# Patient Record
Sex: Female | Born: 1975 | Race: White | Hispanic: No | Marital: Married | Smoking: Former smoker
Health system: Southern US, Community
[De-identification: ages and names within clinical notes are randomized; demographics above are authoritative.]

## PROBLEM LIST (undated history)

## (undated) DIAGNOSIS — Z889 Allergy status to unspecified drugs, medicaments and biological substances status: Secondary | ICD-10-CM

## (undated) DIAGNOSIS — T7840XA Allergy, unspecified, initial encounter: Secondary | ICD-10-CM

## (undated) HISTORY — DX: Allergy, unspecified, initial encounter: T78.40XA

## (undated) HISTORY — PX: OTHER SURGICAL HISTORY: SHX169

## (undated) HISTORY — PX: MINI-LAPAROTOMY: SHX2035

---

## 2004-09-18 ENCOUNTER — Inpatient Hospital Stay (HOSPITAL_COMMUNITY): Admission: AD | Admit: 2004-09-18 | Discharge: 2004-09-18 | Payer: Self-pay | Admitting: *Deleted

## 2004-09-20 ENCOUNTER — Inpatient Hospital Stay (HOSPITAL_COMMUNITY): Admission: AD | Admit: 2004-09-20 | Discharge: 2004-09-22 | Payer: Self-pay | Admitting: Obstetrics and Gynecology

## 2010-02-24 ENCOUNTER — Inpatient Hospital Stay (HOSPITAL_COMMUNITY): Admission: AD | Admit: 2010-02-24 | Payer: Self-pay | Admitting: Obstetrics and Gynecology

## 2010-06-10 ENCOUNTER — Inpatient Hospital Stay (HOSPITAL_COMMUNITY)
Admission: AD | Admit: 2010-06-10 | Discharge: 2010-06-10 | Disposition: A | Discharge status: Home / Self Care | Payer: 59 | Source: Ambulatory Visit | Attending: Obstetrics & Gynecology | Admitting: Obstetrics & Gynecology

## 2010-06-10 DIAGNOSIS — K645 Perianal venous thrombosis: Secondary | ICD-10-CM | POA: Insufficient documentation

## 2010-06-10 DIAGNOSIS — O228X9 Other venous complications in pregnancy, unspecified trimester: Secondary | ICD-10-CM | POA: Insufficient documentation

## 2010-06-14 ENCOUNTER — Inpatient Hospital Stay (HOSPITAL_COMMUNITY)
Admission: AD | Admit: 2010-06-14 | Discharge: 2010-06-16 | DRG: 775 | Disposition: A | Discharge status: Home / Self Care | Payer: 59 | Source: Ambulatory Visit | Attending: Obstetrics & Gynecology | Admitting: Obstetrics & Gynecology

## 2010-06-14 DIAGNOSIS — O878 Other venous complications in the puerperium: Principal | ICD-10-CM | POA: Diagnosis present

## 2010-06-14 DIAGNOSIS — K649 Unspecified hemorrhoids: Secondary | ICD-10-CM | POA: Diagnosis present

## 2010-06-14 LAB — CBC
HCT: 35.6 % — ABNORMAL LOW (ref 36.0–46.0)
Hemoglobin: 12.1 g/dL (ref 12.0–15.0)
RBC: 3.83 MIL/uL — ABNORMAL LOW (ref 3.87–5.11)
WBC: 8.9 10*3/uL (ref 4.0–10.5)

## 2010-06-14 LAB — RPR: RPR Ser Ql: NONREACTIVE

## 2010-06-15 LAB — CBC
HCT: 32.6 % — ABNORMAL LOW (ref 36.0–46.0)
Hemoglobin: 11.2 g/dL — ABNORMAL LOW (ref 12.0–15.0)
MCV: 93.1 fL (ref 78.0–100.0)
RBC: 3.5 MIL/uL — ABNORMAL LOW (ref 3.87–5.11)
RDW: 13 % (ref 11.5–15.5)
WBC: 16 10*3/uL — ABNORMAL HIGH (ref 4.0–10.5)

## 2010-07-13 NOTE — H&P (Signed)
NAMEDENISIA, HARPOLE                  ACCOUNT NO.:  [id number]   MEDICAL RECORD NO.:  [id number]           PATIENT TYPE:   LOCATION:                                 FACILITY:   PHYSICIAN:  Hatton B. Earlene Plater, M.D.  DATE OF BIRTH:  [birth year]   DATE OF ADMISSION:  DATE OF DISCHARGE:                                HISTORY & PHYSICAL   ADMISSION DIAGNOSES:  1.  Thirty-eight week intrauterine pregnancy.  2.  Low-grade fever.  3.  Contractions.  4.  Group B strep positive.   HISTORY OF PRESENT ILLNESS:  A 35 year old white female, gravida 4, para 2,  ectopic 1, who presents at 38+ weeks after multiple visits to the office and  hospital to be ruled out for labor.  States today she has noticed some  chills and low-grade fever of about 100 degrees at home and irregular  contractions.  She has no other localizing symptoms such as urinary or  respiratory to explain the fever.  Given the low-grade fever at term with  the history of group B strep and multiple recent exams, she is admitted for  induction and antibiotic coverage in case this represents group B strep-  related issue.   PRENATAL CARE:  Ma Hillock, OB/GYN, [place]. Earlene Plater, M.D..  Otherwise  uncomplicated.   PAST MEDICAL HISTORY:  IBS and hemorrhoids and cervical dysplasia.   PAST SURGICAL HISTORY:  Left salpingectomy for ectopic pregnancy.   MEDICATIONS:  Prenatal vitamins.   ALLERGIES:  No drug allergies noted.   SOCIAL HISTORY:  No alcohol, tobacco or other drugs.   PHYSICAL EXAMINATION:  VITAL SIGNS:  Weight 207, blood pressure 130/70,  temperature of 100.8, fetal heart tones 176.  GENERAL:  Alert and oriented, in no acute distress.  SKIN:  Warm and dry, no lesions.  CARDIAC:  Regular rate and rhythm.  CHEST:  Lungs clear to auscultation.  ABDOMEN:  Fundal height is appropriate.  The uterus is nontender.  PELVIC:  Cervix is 3-4 cm dilated, 50% effaced, -1 station, membranes are  intact.   ASSESSMENT:  A 38+ week  intrauterine pregnancy with low-grade fever and  complaints of chills.  Cannot rule out group B strep-related infection.  Given that she has had multiple cervical exams in the recent days and does  have  advanced dilation, we discussed this could be the potential issue.  Therefore, recommend delivery and coverage with antibiotics in the meantime.  Therefore, the patient will be admitted and induced, and will give her  penicillin and gentamicin during labor.       WBD/MEDQ  D:  09/20/2004  T:  09/20/2004  Job:  [id number]

## 2013-09-14 ENCOUNTER — Encounter (HOSPITAL_COMMUNITY): Payer: Self-pay | Admitting: Emergency Medicine

## 2013-09-14 DIAGNOSIS — L0889 Other specified local infections of the skin and subcutaneous tissue: Secondary | ICD-10-CM | POA: Insufficient documentation

## 2013-09-14 DIAGNOSIS — R6883 Chills (without fever): Secondary | ICD-10-CM | POA: Insufficient documentation

## 2013-09-14 LAB — COMPREHENSIVE METABOLIC PANEL
ALT: 17 U/L (ref 0–35)
AST: 17 U/L (ref 0–37)
Albumin: 4.8 g/dL (ref 3.5–5.2)
Alkaline Phosphatase: 26 U/L — ABNORMAL LOW (ref 39–117)
Anion gap: 14 (ref 5–15)
BUN: 12 mg/dL (ref 6–23)
CALCIUM: 9.7 mg/dL (ref 8.4–10.5)
CO2: 26 mEq/L (ref 19–32)
CREATININE: 0.74 mg/dL (ref 0.50–1.10)
Chloride: 100 mEq/L (ref 96–112)
GFR calc non Af Amer: >90 mL/min (ref >90)
Glucose, Bld: 108 mg/dL — ABNORMAL HIGH (ref 70–99)
Potassium: 4.1 mEq/L (ref 3.7–5.3)
SODIUM: 140 meq/L (ref 137–147)
TOTAL PROTEIN: 8.1 g/dL (ref 6.0–8.3)
Total Bilirubin: 0.6 mg/dL (ref 0.3–1.2)

## 2013-09-14 LAB — CBC
HCT: 41.8 % (ref 36.0–46.0)
HEMOGLOBIN: 14.6 g/dL (ref 12.0–15.0)
MCH: 31.9 pg (ref 26.0–34.0)
MCHC: 34.9 g/dL (ref 30.0–36.0)
MCV: 91.3 fL (ref 78.0–100.0)
Platelets: 266 10*3/uL (ref 150–400)
RBC: 4.58 MIL/uL (ref 3.87–5.11)
RDW: 12.9 % (ref 11.5–15.5)
WBC: 12.3 10*3/uL — AB (ref 4.0–10.5)

## 2013-09-14 NOTE — ED Notes (Signed)
Pt reports several draining bumps on inner thigh by groin. Pt reports Several drained and then healed. Pt reports another one suddenly developed and she is having chills. Tachy. Wound is not draining at this time. Inner right thigh beside labia majora is reddened.

## 2013-09-15 ENCOUNTER — Emergency Department (HOSPITAL_COMMUNITY)
Admission: EM | Admit: 2013-09-15 | Discharge: 2013-09-15 | Discharge status: Against Medical Advice | Payer: BC Managed Care – PPO | Attending: Emergency Medicine | Admitting: Emergency Medicine

## 2013-09-15 NOTE — ED Notes (Signed)
Registration called patient will no answser

## 2013-09-15 NOTE — ED Notes (Signed)
Pt called for room.  No answer x2  

## 2016-05-27 ENCOUNTER — Telehealth: Payer: Self-pay | Admitting: Behavioral Health

## 2016-05-27 NOTE — Telephone Encounter (Signed)
Attempted to reach patient for Pre-Visit Call. Unable to leave a message; per recording, the voice mailbox has not been setup.    

## 2016-05-29 ENCOUNTER — Ambulatory Visit (INDEPENDENT_AMBULATORY_CARE_PROVIDER_SITE_OTHER): Payer: Managed Care, Other (non HMO) | Admitting: Family Medicine

## 2016-05-29 ENCOUNTER — Ambulatory Visit: Payer: Self-pay | Admitting: Family

## 2016-05-29 ENCOUNTER — Encounter: Payer: Self-pay | Admitting: Family Medicine

## 2016-05-29 VITALS — BP 102/58 | HR 70 | Temp 98.3°F | Resp 16 | Ht 66.0 in | Wt 203.8 lb

## 2016-05-29 DIAGNOSIS — L0291 Cutaneous abscess, unspecified: Secondary | ICD-10-CM

## 2016-05-29 MED ORDER — DOXYCYCLINE HYCLATE 100 MG PO TABS: 100.0000 mg | ORAL_TABLET | Freq: Two times a day (BID) | ORAL | 0 refills | Status: DC

## 2016-05-29 NOTE — Progress Notes (Signed)
Chief Complaint  Patient presents with  . New Patient (Initial Visit)    pt report she has a bump on left to middle side of back. Patient report it started as a black head four years ago, it popped but came recently bigger and itchy.       New Patient Visit SUBJECTIVE: HPI: Kristi Walton is an 41 y.o.female who is being seen for establishing care.  The patient was previously seen at [facility] in [place], recently moved.  Patient has had a bump on her back for several years. Over the past 3 weeks, has gotten larger, somewhat itchy, painful. Since it is on her back, she does not have a good view of it. She is unsure if it is red. Nothing has drained from of thus far. She denies any fevers. She does have a history of MRSA.  Allergies  Allergen Reactions  . Sulfa Antibiotics     Past Medical History:  Diagnosis Date  . Allergy    Past Surgical History:  Procedure Laterality Date  . ivf     july 2003  . MINI-LAPAROTOMY Left    july 2003   Social History   Social History  . Marital status: Married   Social History Main Topics  . Smoking status: Former Smoker    Packs/day: 0.75    Years: 8.00    Types: Cigarettes    Quit date: 02/25/2002  . Smokeless tobacco: Never Used  . Alcohol use No  . Drug use: No  . Sexual activity: Yes    Partners: Male     Comment: Partner had vasectomy   Family History  Problem Relation Age of Onset  . Heart disease Neg Hx   . Cancer Neg Hx     Takes no medications routinely.  ROS Constitutional: Denies fevers  Skin: As noted in HPI   OBJECTIVE: BP (!) 102/58 (BP Location: Left Arm, Patient Position: Sitting, Cuff Size: Large)   Pulse 70   Temp 98.3 F (36.8 C) (Oral)   Resp 16   Ht  (1.676 m)   Wt 203 lb 12.8 oz (92.4 kg)   LMP 05/11/2016 (Exact Date)   SpO2 98%   BMI 32.89 kg/m   Constitutional: -  VS reviewed -  Well developed, well nourished, appears stated age -  No apparent distress  Psychiatric: -  Oriented  to person, place, and time -  Memory intact -  Affect and mood normal -  Fluent conversation, good eye contact -  Judgment and insight age appropriate  Eye: -  Conjunctivae clear, no discharge -  Pupils symmetric, round, reactive to light  ENMT: -  Oral mucosa without lesions, tongue and uvula midline    Tonsils not enlarged, no erythema, no exudate, trachea midline    Pharynx moist, no lesions, no erythema  Neck: -  No gross swelling, no palpable masses -  Thyroid midline, not enlarged, mobile, no palpable masses  Cardiovascular: -  RRR  Respiratory: -  Normal respiratory effort, no accessory muscle use, no retraction -  Breath sounds equal, no wheezes, no ronchi, no crackles  Musculoskeletal: -  No clubbing, no cyanosis -  Gait normal  Skin: -  On L side of back at approx T8-9, there is an elliptically shaped area of erythema with a pustular central opening, +fluctuance, +TTP, no streaking erythema -  Warm and dry to palpation   Procedure note; incision and drainage Informed consent obtained. The area was cleaned with Hibiclens.  The area was anesthetized with 2 mL of 1% lidocaine with epinephrine. Once adequate anesthesia was obtained, a cruciate incision was made with 11 blade scalpel. Approximately 10 mL of purulent material with blood was expressed. Cultures were taken. Loculations were interrupted with a curved hemostat. The area was packed with approximately 8 cm of 0.25 in iodoform gauze. The area was then dressed with gauze and a bandage. There were no complications noted. The patient tolerated the procedure well.   ASSESSMENT/PLAN: Abscess - Plan: doxycycline (VIBRA-TABS) 100 MG tablet  Patient instructed to sign release of records form from her previous PCP. Aftercare instructions given. Patient should return in 1 week to have wick removed. The patient voiced understanding and agreement to the plan.   Jilda Roche [place], DO 05/29/16  12:29 PM

## 2016-05-29 NOTE — Patient Instructions (Signed)
Do not shower for the rest of the day. When you do wash it, use only soap and water. Do not vigorously scrub. Apply triple antibiotic ointment (like Neosporin) twice daily. Keep the area clean and dry.   Things to look out for: increasing pain not relieved by ibuprofen/acetaminophen, fevers, spreading redness, drainage of pus, or foul odor.  

## 2016-05-29 NOTE — Progress Notes (Signed)
Pre visit review using our clinic review tool, if applicable. No additional management support is needed unless otherwise documented below in the visit note. 

## 2016-06-05 ENCOUNTER — Ambulatory Visit (INDEPENDENT_AMBULATORY_CARE_PROVIDER_SITE_OTHER): Payer: Managed Care, Other (non HMO) | Admitting: Family Medicine

## 2016-06-05 VITALS — BP 106/47 | HR 70 | Temp 98.6°F | Ht 66.0 in | Wt 205.2 lb

## 2016-06-05 DIAGNOSIS — Z09 Encounter for follow-up examination after completed treatment for conditions other than malignant neoplasm: Secondary | ICD-10-CM

## 2016-06-05 NOTE — Progress Notes (Signed)
Pre visit review using our clinic tool,if applicable. No additional management support is needed unless otherwise documented below in the visit note.  

## 2016-06-05 NOTE — Progress Notes (Signed)
Chief Complaint  Patient presents with  . Follow-up    Unpack area where cyst was removed   No fevers, pain, drainage.  BP (!) 106/47   Pulse 70   Temp 98.6 F (37 C) (Oral)   Ht  (1.676 m)   Wt 205 lb 3.2 oz (93.1 kg)   LMP 06/05/2016   SpO2 100%   BMI 33.12 kg/m   Encounter for recheck of abscess following incision and drainage  Site looks well, packing removed. Bandage replaced. Pt tolerated well. All questions answered. F/u prn. Pt voiced understanding and agreement to the plan.   Jilda Roche [place], DO 06/05/16 10:22 AM

## 2016-09-24 ENCOUNTER — Encounter: Payer: Self-pay | Admitting: Medical

## 2016-09-24 ENCOUNTER — Ambulatory Visit (INDEPENDENT_AMBULATORY_CARE_PROVIDER_SITE_OTHER): Payer: Managed Care, Other (non HMO) | Admitting: Medical

## 2016-09-24 VITALS — BP 121/61 | HR 65 | Temp 98.2°F | Resp 16 | Ht 66.0 in | Wt 202.6 lb

## 2016-09-24 DIAGNOSIS — N390 Urinary tract infection, site not specified: Secondary | ICD-10-CM

## 2016-09-24 DIAGNOSIS — R300 Dysuria: Secondary | ICD-10-CM | POA: Diagnosis not present

## 2016-09-24 LAB — POC URINALSYSI DIPSTICK (AUTOMATED)
Bilirubin, UA: NEGATIVE
Blood, UA: NEGATIVE
GLUCOSE UA: NEGATIVE
KETONES UA: NEGATIVE
LEUKOCYTES UA: NEGATIVE
Nitrite, UA: NEGATIVE
PROTEIN UA: NEGATIVE
Spec Grav, UA: 1.01 (ref 1.010–1.025)
UROBILINOGEN UA: NEGATIVE U/dL — AB
pH, UA: 6 (ref 5.0–8.0)

## 2016-09-24 MED ORDER — NITROFURANTOIN MONOHYD MACRO 100 MG PO CAPS: 100.0000 mg | ORAL_CAPSULE | Freq: Two times a day (BID) | ORAL | 0 refills | Status: DC

## 2016-09-24 NOTE — Patient Instructions (Addendum)
You appear to have a urinary tract infection by exam and symptoms. I am prescribing macrobid  antibiotic for the probable infection. Hydrate well. I am sending out a urine culture. During the interim if your signs and symptoms worsen rather than improving please notify us. We will notify your when the culture results are back.  Follow up in 7 days or as needed.

## 2016-09-24 NOTE — Progress Notes (Signed)
Subjective:    Patient ID: Kristi Walton, female    DOB: 07/07/1975, 41 y.o.   MRN: [id number]  HPI  Pt in today reporting urinary symptoms since past Thursday.  Dysuria- yes Frequent urination-yes Hesitancy- mild on initiating flow. Suprapubic pressure-yes Fever-no chills-no Nausea- mild 2 days. Vomiting-no CVA pain-faint sore lower back. History of UTI- 4 years ago last time.  Gross hematuria-no.  LMP-September 06, 2016. Normal  and came when was expected.  Pt has gyn appointment for Monday.  Pt has hydrated a lot and drinking cranberry juice.     Review of Systems  Constitutional: Negative for chills, fatigue and fever.  Respiratory: Negative for cough, shortness of breath and wheezing.   Cardiovascular: Negative for chest pain and palpitations.  Gastrointestinal: Negative for abdominal pain.  Genitourinary: Positive for dysuria, frequency and urgency. Negative for decreased urine volume, flank pain, hematuria, vaginal bleeding, vaginal discharge and vaginal pain.  Musculoskeletal: Negative for arthralgias and joint swelling.       See hpi for back pain.  Neurological: Negative for dizziness and headaches.  Hematological: Negative for adenopathy.  Psychiatric/Behavioral: Negative for behavioral problems and confusion.   Past Medical History:  Diagnosis Date  . Allergy      Social History   Social History  . Marital status: Married    Spouse name: N/A  . Number of children: N/A  . Years of education: N/A   Occupational History  . Not on file.   Social History Main Topics  . Smoking status: Former Smoker    Packs/day: 0.75    Years: 8.00    Types: Cigarettes    Quit date: 02/25/2002  . Smokeless tobacco: Never Used  . Alcohol use No  . Drug use: No  . Sexual activity: Yes    Partners: Male     Comment: Partner had vasectomy   Other Topics Concern  . Not on file   Social History Narrative  . No narrative on file    Past Surgical History:    Procedure Laterality Date  . ivf     july 2003  . MINI-LAPAROTOMY Left    july 2003    Family History  Problem Relation Age of Onset  . Heart disease Neg Hx   . Cancer Neg Hx     Allergies  Allergen Reactions  . Sulfa Antibiotics     No current outpatient prescriptions on file prior to visit.   No current facility-administered medications on file prior to visit.     BP 121/61   Pulse 65   Temp 98.2 F (36.8 C) (Oral)   Resp 16   Ht 5\' 6"  (1.676 m)   Wt 202 lb 9.6 oz (91.9 kg)   SpO2 100%   BMI 32.70 kg/m       Objective:   Physical Exam  General Appearance- Not in acute distress.  HEENT Eyes- Scleraeral/Conjuntiva-bilat- Not Yellow. Mouth & Throat- Normal.  Chest and Lung Exam Auscultation: Breath sounds:-Normal. Adventitious sounds:- No Adventitious sounds.  Cardiovascular Auscultation:Rythm - Regular. Heart Sounds -Normal heart sounds.  Abdomen Inspection:-Inspection Normal.  Palpation/Perucssion: Palpation and Percussion of the abdomen reveal- faint suprapubic Tendeness, No Rebound tenderness, No rigidity(Guarding) and No Palpable abdominal masses.  Liver:-Normal.  Spleen:- Normal.   Back- no cva pain.      Assessment & Plan:  You appear to have a urinary tract infection by exam and symptoms. I am prescribing macrobid  antibiotic for the probable infection. Hydrate well. I am  sending out a urine culture. During the interim if your signs and symptoms worsen rather than improving please notify us. We will notify your when the culture results are back.  Follow up in 7 days or as needed.  Taylinn Brabant, Ramon DredgeEdward, PA-C

## 2016-09-25 LAB — URINE CULTURE: Organism ID, Bacteria: NO GROWTH

## 2016-09-30 ENCOUNTER — Ambulatory Visit (INDEPENDENT_AMBULATORY_CARE_PROVIDER_SITE_OTHER): Payer: Managed Care, Other (non HMO) | Admitting: Family Medicine

## 2016-09-30 ENCOUNTER — Encounter: Payer: Self-pay | Admitting: Family Medicine

## 2016-09-30 VITALS — BP 109/72 | HR 63 | Ht 65.0 in | Wt 201.0 lb

## 2016-09-30 DIAGNOSIS — Z124 Encounter for screening for malignant neoplasm of cervix: Secondary | ICD-10-CM | POA: Diagnosis not present

## 2016-09-30 DIAGNOSIS — Z01419 Encounter for gynecological examination (general) (routine) without abnormal findings: Secondary | ICD-10-CM | POA: Diagnosis not present

## 2016-09-30 DIAGNOSIS — R300 Dysuria: Secondary | ICD-10-CM

## 2016-09-30 MED ORDER — PHENAZOPYRIDINE HCL 200 MG PO TABS: 200.0000 mg | ORAL_TABLET | Freq: Three times a day (TID) | ORAL | 1 refills | Status: DC | PRN

## 2016-09-30 NOTE — Progress Notes (Signed)
GYNECOLOGY ANNUAL PREVENTATIVE CARE ENCOUNTER NOTE  Subjective:   Kristi Walton is a 41 y.o. [phone] female here for a routine annual gynecologic exam.  Current complaints: dysuria and pelvic pressure. Started 11 days ago. Having frequency, hesitancy, suprapubic pressure. Stop primary care physician on 7/31, was prescribed Macrobid - UA and urine culture normal, however patient decided to continue the antibiotics. Today is her last day of antibiotics. This felt that her symptoms have improved, however have not resolved.   Denies abnormal vaginal bleeding, discharge, pelvic pain, problems with intercourse or other gynecologic concerns.    Menses - every 30 days x5-6 days. Monogamous relationship  Gynecologic History Patient's last menstrual period was 09/06/2016. Patient is sexually active  Contraception: vasectomy Last Pap: 3 years ago. Results were: normal Last mammogram: n/a.   Obstetric History OB History  Gravida Para Term Preterm AB Living  4 4 4     4   SAB TAB Ectopic Multiple Live Births          4    # Outcome Date GA Lbr Len/2nd Weight Sex Delivery Anes PTL Lv  4 Term 06/14/10 [redacted]w[redacted]d   M Vag-Spont   LIV  3 Term 09/20/04 [redacted]w[redacted]d   F Vag-Spont   LIV  2 Term 11/05/02 [redacted]w[redacted]d   M Vag-Spont   LIV  1 Term 08/31/97 [redacted]w[redacted]d   F Vag-Spont   LIV      Past Medical History:  Diagnosis Date  . Allergy     Past Surgical History:  Procedure Laterality Date  . ivf     july 2003  . MINI-LAPAROTOMY Left    july 2003    Current Outpatient Prescriptions on File Prior to Visit  Medication Sig Dispense Refill  . nitrofurantoin, macrocrystal-monohydrate, (MACROBID) 100 MG capsule Take 1 capsule (100 mg total) by mouth 2 (two) times daily. 14 capsule 0   No current facility-administered medications on file prior to visit.     Allergies  Allergen Reactions  . Sulfa Antibiotics     Social History   Social History  . Marital status: Married    Spouse name: N/A  . Number of  children: N/A  . Years of education: N/A   Occupational History  . Not on file.   Social History Main Topics  . Smoking status: Former Smoker    Packs/day: 0.75    Years: 8.00    Types: Cigarettes    Quit date: 02/25/2002  . Smokeless tobacco: Never Used  . Alcohol use No  . Drug use: No  . Sexual activity: Yes    Partners: Male    Birth control/ protection: None     Comment: Partner had vasectomy   Other Topics Concern  . Not on file   Social History Narrative  . No narrative on file    Family History  Problem Relation Age of Onset  . Breast cancer Maternal Aunt   . Heart disease Neg Hx   . Cancer Neg Hx     The following portions of the patient's history were reviewed and updated as appropriate: allergies, current medications, past family history, past medical history, past social history, past surgical history and problem list.  Review of Systems Pertinent items are noted in HPI.   Objective:  BP 109/72   Pulse 63   Ht 5\' 5"  (1.651 m)   Wt 201 lb (91.2 kg)   LMP 09/06/2016   BMI 33.45 kg/m  CONSTITUTIONAL: Well-developed, well-nourished female in no  acute distress.  HENT:  Normocephalic, atraumatic, External right and left ear normal. Oropharynx is clear and moist EYES: Conjunctivae and EOM are normal. Pupils are equal, round, and reactive to light. No scleral icterus.  NECK: Normal range of motion, supple, no masses.  Normal thyroid.   CARDIOVASCULAR: Normal heart rate noted, regular rhythm RESPIRATORY: Clear to auscultation bilaterally. Effort and breath sounds normal, no problems with respiration noted. BREASTS: Symmetric in size. No masses, skin changes, nipple drainage, or lymphadenopathy. ABDOMEN: Soft, normal bowel sounds, no distention noted.  No tenderness, rebound or guarding.  PELVIC: Normal appearing external genitalia; normal appearing vaginal mucosa and cervix.  No abnormal discharge noted.  Pap smear obtained.  Normal uterine size, no other  palpable masses, no uterine or adnexal tenderness. MUSCULOSKELETAL: Normal range of motion. No tenderness.  No cyanosis, clubbing, or edema.  2+ distal pulses. SKIN: Skin is warm and dry. No rash noted. Not diaphoretic. No erythema. No pallor. NEUROLOGIC: Alert and oriented to person, place, and time. Normal reflexes, muscle tone coordination. No cranial nerve deficit noted. PSYCHIATRIC: Normal mood and affect. Normal behavior. Normal judgment and thought content.  Assessment:  Annual gynecologic examination with pap smear   Plan:  1. Annual Gyn Exam Will follow up results of pap smear and manage accordingly. Mammogram scheduled STD testing discussed. Patient declined testing Discussed exercise and diet Will have routine labs done by PCP  2. Dysuria No gyn source of patient's discomfort. Will prescribe pyridium for symptomatic relief for the next several days. No evidence of infection - will see if improves over the next 7-10 days. If worsens or doesn't improve, would recommend referral to urology.  Routine preventative health maintenance measures emphasized. Please refer to After Visit Summary for other counseling recommendations.    Candelaria CelesteJacob Railyn House, DO [facility]

## 2016-09-30 NOTE — Patient Instructions (Signed)

## 2016-09-30 NOTE — Progress Notes (Signed)
Pt states she is due for pap smear, last 3 years ago. Pt has not yet had mammogram. Pt states she recently tx for UTI at PCP, currently taking abx. Pt states she is having some lower pelvic pain and heaviness.

## 2016-10-01 ENCOUNTER — Ambulatory Visit (HOSPITAL_BASED_OUTPATIENT_CLINIC_OR_DEPARTMENT_OTHER)
Admission: RE | Admit: 2016-10-01 | Discharge: 2016-10-01 | Disposition: A | Discharge status: Home / Self Care | Payer: Managed Care, Other (non HMO) | Source: Ambulatory Visit | Attending: Family Medicine | Admitting: Family Medicine

## 2016-10-01 ENCOUNTER — Encounter (HOSPITAL_BASED_OUTPATIENT_CLINIC_OR_DEPARTMENT_OTHER): Payer: Self-pay

## 2016-10-01 DIAGNOSIS — Z01419 Encounter for gynecological examination (general) (routine) without abnormal findings: Secondary | ICD-10-CM

## 2016-10-01 DIAGNOSIS — Z1231 Encounter for screening mammogram for malignant neoplasm of breast: Secondary | ICD-10-CM | POA: Insufficient documentation

## 2016-10-02 LAB — CYTOLOGY - PAP: DIAGNOSIS: NEGATIVE

## 2017-10-22 ENCOUNTER — Ambulatory Visit: Payer: Managed Care, Other (non HMO) | Admitting: Family Medicine

## 2017-12-19 ENCOUNTER — Encounter: Payer: Self-pay | Admitting: Medical

## 2017-12-19 ENCOUNTER — Ambulatory Visit (HOSPITAL_BASED_OUTPATIENT_CLINIC_OR_DEPARTMENT_OTHER)
Admission: RE | Admit: 2017-12-19 | Discharge: 2017-12-19 | Disposition: A | Discharge status: Home / Self Care | Payer: Managed Care, Other (non HMO) | Source: Ambulatory Visit | Attending: Medical | Admitting: Medical

## 2017-12-19 ENCOUNTER — Ambulatory Visit (INDEPENDENT_AMBULATORY_CARE_PROVIDER_SITE_OTHER): Payer: Managed Care, Other (non HMO) | Admitting: Medical

## 2017-12-19 VITALS — BP 117/68 | HR 72 | Temp 98.7°F | Resp 16 | Ht 65.0 in | Wt 201.6 lb

## 2017-12-19 DIAGNOSIS — M79662 Pain in left lower leg: Secondary | ICD-10-CM

## 2017-12-19 DIAGNOSIS — M5442 Lumbago with sciatica, left side: Secondary | ICD-10-CM

## 2017-12-19 DIAGNOSIS — M79672 Pain in left foot: Secondary | ICD-10-CM | POA: Diagnosis not present

## 2017-12-19 NOTE — Progress Notes (Signed)
Subjective:    Patient ID: Kristi Walton, female    DOB: 07-Dec-1975, 42 y.o.   MRN: [id number]  HPI  Pt in for some recent foot pain for months. 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 After limping more than usual  past 2 weeks she states she has developed low back pain with some radicular symptoms. Hx of back pain in past for 20 yrs but usually only one week  a year. States pain might last 5-7 days.   Pt states foot at times feels cold but not all the time.   Pt states foot pain has been since august. Has effected gait since august.   LMP- Dec 17, 2017.   No calf pain. No swelling. No sob. No long trips. Non smoker. No family history of DVT. No birth    Pt saw podiatrist 2 weeks ago. She states steroid injection helped for about one week but did not help past week.  Review of Systems  Constitutional: Negative for chills, fatigue and fever.  Respiratory: Negative for cough, chest tightness, shortness of breath and wheezing.   Cardiovascular: Negative for chest pain and palpitations.  Gastrointestinal: Negative for abdominal pain.  Musculoskeletal: Negative for back pain, myalgias and neck pain.       Foot pain and back pain.  Skin: Negative for rash.  Neurological: Negative for dizziness, seizures, speech difficulty, weakness, numbness and headaches.  Hematological: Negative for adenopathy. Does not bruise/bleed easily.  Psychiatric/Behavioral: Negative for behavioral problems and confusion.   Past Medical History:  Diagnosis Date  . Allergy      Social History   Socioeconomic History  . Marital status: Married    Spouse name: Not on file  . Number of children: Not on file  . Years of education: Not on file  . Highest education level: Not on file  Occupational History  . Not on file  Social Needs  . Financial resource strain: Not on file  . Food insecurity:    Worry: Not on file    Inability: Not on file  . Transportation needs:    Medical: Not on file    Non-medical:  Not on file  Tobacco Use  . Smoking status: Former Smoker    Packs/day: 0.75    Years: 8.00    Pack years: 6.00    Types: Cigarettes    Last attempt to quit: 02/25/2002    Years since quitting: 15.8  . Smokeless tobacco: Never Used  Substance and Sexual Activity  . Alcohol use: No  . Drug use: No  . Sexual activity: Yes    Partners: Male    Birth control/protection: None    Comment: Partner had vasectomy  Lifestyle  . Physical activity:    Days per week: Not on file    Minutes per session: Not on file  . Stress: Not on file  Relationships  . Social connections:    Talks on phone: Not on file    Gets together: Not on file    Attends religious service: Not on file    Active member of club or organization: Not on file    Attends meetings of clubs or organizations: Not on file    Relationship status: Not on file  . Intimate partner violence:    Fear of current or ex partner: Not on file    Emotionally abused: Not on file    Physically abused: Not on file    Forced sexual activity: Not on file  Other Topics  Concern  . Not on file  Social History Narrative  . Not on file    Past Surgical History:  Procedure Laterality Date  . ivf     july 2003  . MINI-LAPAROTOMY Left    july 2003    Family History  Problem Relation Age of Onset  . Breast cancer Maternal Aunt   . Heart disease Neg Hx   . Cancer Neg Hx     Allergies  Allergen Reactions  . Sulfa Antibiotics     Current Outpatient Medications on File Prior to Visit  Medication Sig Dispense Refill  . nitrofurantoin, macrocrystal-monohydrate, (MACROBID) 100 MG capsule Take 1 capsule (100 mg total) by mouth 2 (two) times daily. 14 capsule 0  . phenazopyridine (PYRIDIUM) 200 MG tablet Take 1 tablet (200 mg total) by mouth 3 (three) times daily as needed for pain (urethral spasm). 10 tablet 1   No current facility-administered medications on file prior to visit.     BP 117/68   Pulse 72   Temp 98.7 F (37.1 C)  (Oral)   Resp 16   Ht 5\' 5"  (1.651 m)   Wt 201 lb 9.6 oz (91.4 kg)   SpO2 100%   BMI 33.55 kg/m       Objective:   Physical Exam  General Appearance- Not in acute distress.    Chest and Lung Exam Auscultation: Breath sounds:-Normal. Clear even and unlabored. Adventitious sounds:- No Adventitious sounds.  Cardiovascular Auscultation:Rythm - Regular, rate and rythm. Heart Sounds -Normal heart sounds.  Abdomen Inspection:-Inspection Normal.  Palpation/Perucssion: Palpation and Percussion of the abdomen reveal- Non Tender, No Rebound tenderness, No rigidity(Guarding) and No Palpable abdominal masses.  Liver:-Normal.  Spleen:- Normal.   Back Mid lumbar spine tenderness to palpation left si area.  Pain on straight leg lift. Pain on lateral movements and flexion/extension of the spine. Patella reflexes- hyper-reflex.  Lower ext neurologic  L5-S1 sensation intact bilaterally.(but pain lifting her left great toe. Pain present before back flare) Normal patellar reflexes bilaterally. No foot drop bilaterally.  Left foot-nml pulses. Good capillary refill. Slight colder to touch than rt side. Left lower ext- no swelling to calf, neg homans signs. Symetric compared to rt side.      Assessment & Plan:  For your recent left-sided sciatica type back pain, you can continue with Tylenol and conservative back stretching exercises.  If the back pain persist or worsens keep in mind that could send in taper dose of prednisone and prescription of gabapentin for neuropathic/nerve type pain.  Please get x-ray of the lumbar spine today.  If x-ray were normal and radicular type pain persist then might consider MRI.  For your left foot pain/sesamoiditis, continue treatment regimen prescribed by podiatrist.  Since the pain is persisting despite these measures then I would strongly consider getting the MRI as podiatrist mentioned.  You expressed concern for left lower extremity DVT.  After  discussion decided we would go ahead and get left lower extremity ultrasound today.  You can get that done after you get your lumbar spine x-ray.  Follow-up in 7 to 10 days or as needed.  Red flag signs symptoms regarding back pain that would indicate need for ED evaluation reviewed  Esperanza Richters, PA-C

## 2017-12-19 NOTE — Patient Instructions (Signed)
For your recent left-sided sciatica type back pain, you can continue with Tylenol and conservative back stretching exercises.  If the back pain persist or worsens keep in mind that could send in taper dose of prednisone and prescription of gabapentin for neuropathic/nerve type pain.  Please get x-ray of the lumbar spine today.  If x-ray were normal and radicular type pain persist then might consider MRI.  For your left foot pain/sesamoiditis, continue treatment regimen prescribed by podiatrist.  Since the pain is persisting despite these measures then I would strongly consider getting the MRI as podiatrist mentioned.  You expressed concern for left lower extremity DVT.  After discussion decided we would go ahead and get left lower extremity ultrasound today.  You can get that done after you get your lumbar spine x-ray.  Follow-up in 7 to 10 days or as needed.  Red flag signs symptoms regarding back pain that would indicate need for ED evaluation reviewed.

## 2018-03-30 ENCOUNTER — Encounter: Payer: Self-pay | Admitting: Medical

## 2018-03-30 ENCOUNTER — Ambulatory Visit (INDEPENDENT_AMBULATORY_CARE_PROVIDER_SITE_OTHER): Payer: BLUE CROSS/BLUE SHIELD | Admitting: Medical

## 2018-03-30 VITALS — BP 119/78 | HR 79 | Resp 12 | Ht 65.0 in | Wt 203.0 lb

## 2018-03-30 DIAGNOSIS — G8929 Other chronic pain: Secondary | ICD-10-CM

## 2018-03-30 DIAGNOSIS — M545 Low back pain: Secondary | ICD-10-CM

## 2018-03-30 NOTE — Patient Instructions (Signed)
For your low back pain, I want you to continue conservative stretching exercises at home and use Tylenol for mild pain.  You can use ibuprofen for more severe pain if needed.  However use minimal dose as ibuprofen is only NSAID that you can tolerate historically.  I did go ahead and place referral to physical therapy.  Hopefully this will help.  If not then might consider sports medicine.  Also occupational therapy might be helpful to minimize pain with activities of daily living.  Follow-up date to be determined after appointments with physical therapy.  Also follow-up as regular scheduled with your PCP.

## 2018-03-30 NOTE — Progress Notes (Signed)
Subjective:    Patient ID: Kristi Walton, female    DOB: 05/04/1975, 43 y.o.   MRN: [id number]  HPI  Pt in for follow up.  Pt has long history of back pain. She state back pain hx for 20 years. She had last event from October to about November. She got better but then recently re-fare of pain more than 3 weeks which is unusual for her. Conservative measures are not helping much.  No weakness to leg or radiating pain.  Conservative measures are not helping. She is trying tylenol and some basic stretching exercises.  She wants to be referred to PT  Pt pain level is 1/10 now. But will fare randomly to 8/10.  Most of time time pain level ranges 1-3 or 4.  Pt does not like to use nsaids.(when pain gets severe will use ibuprofen)   Review of Systems  Constitutional: Negative for chills, fatigue and fever.  Respiratory: Negative for cough, chest tightness, shortness of breath and wheezing.   Cardiovascular: Negative for chest pain and palpitations.  Musculoskeletal: Positive for back pain.  Skin: Negative for rash.  Neurological: Negative for dizziness, weakness, numbness and headaches.  Hematological: Negative for adenopathy. Does not bruise/bleed easily.  Psychiatric/Behavioral: Negative for behavioral problems and confusion.    Past Medical History:  Diagnosis Date  . Allergy      Social History   Socioeconomic History  . Marital status: Married    Spouse name: Not on file  . Number of children: Not on file  . Years of education: Not on file  . Highest education level: Not on file  Occupational History  . Not on file  Social Needs  . Financial resource strain: Not on file  . Food insecurity:    Worry: Not on file    Inability: Not on file  . Transportation needs:    Medical: Not on file    Non-medical: Not on file  Tobacco Use  . Smoking status: Former Smoker    Packs/day: 0.75    Years: 8.00    Pack years: 6.00    Types: Cigarettes    Last attempt to quit:  02/25/2002    Years since quitting: 16.1  . Smokeless tobacco: Never Used  Substance and Sexual Activity  . Alcohol use: No  . Drug use: No  . Sexual activity: Yes    Partners: Male    Birth control/protection: None    Comment: Partner had vasectomy  Lifestyle  . Physical activity:    Days per week: Not on file    Minutes per session: Not on file  . Stress: Not on file  Relationships  . Social connections:    Talks on phone: Not on file    Gets together: Not on file    Attends religious service: Not on file    Active member of club or organization: Not on file    Attends meetings of clubs or organizations: Not on file    Relationship status: Not on file  . Intimate partner violence:    Fear of current or ex partner: Not on file    Emotionally abused: Not on file    Physically abused: Not on file    Forced sexual activity: Not on file  Other Topics Concern  . Not on file  Social History Narrative  . Not on file    Past Surgical History:  Procedure Laterality Date  . ivf     july 2003  . MINI-LAPAROTOMY  Left    july 2003    Family History  Problem Relation Age of Onset  . Breast cancer Maternal Aunt   . Heart disease Neg Hx   . Cancer Neg Hx     Allergies  Allergen Reactions  . Sulfa Antibiotics     Current Outpatient Medications on File Prior to Visit  Medication Sig Dispense Refill  . nitrofurantoin, macrocrystal-monohydrate, (MACROBID) 100 MG capsule Take 1 capsule (100 mg total) by mouth 2 (two) times daily. 14 capsule 0  . phenazopyridine (PYRIDIUM) 200 MG tablet Take 1 tablet (200 mg total) by mouth 3 (three) times daily as needed for pain (urethral spasm). 10 tablet 1   No current facility-administered medications on file prior to visit.     BP 119/78   Pulse 79   Resp 12   Ht 5\' 5"  (1.651 m)   Wt 203 lb (92.1 kg)   BMI 33.78 kg/m       Objective:   Physical Exam  General Appearance- Not in acute distress.    Chest and Lung  Exam Auscultation: Breath sounds:-Normal. Clear even and unlabored. Adventitious sounds:- No Adventitious sounds.  Cardiovascular Auscultation:Rythm - Regular, rate and rythm. Heart Sounds -Normal heart sounds.  Abdomen Inspection:-Inspection Normal.  Palpation/Perucssion: Palpation and Percussion of the abdomen reveal- Non Tender, No Rebound tenderness, No rigidity(Guarding) and No Palpable abdominal masses.  Liver:-Normal.  Spleen:- Normal.   Back Mid lumbar spine tenderness to palpation. No pain on straight leg lift. Pain on lateral movements and flexion/extension of the spine.  Lower ext neurologic  L5-S1 sensation intact bilaterally. Normal patellar reflexes bilaterally. No foot drop bilaterally.      Assessment & Plan:  For your low back pain, I want you to continue conservative stretching exercises at home and use Tylenol for mild pain.  You can use ibuprofen for more severe pain if needed.  However use minimal dose as ibuprofen is only NSAID that you can tolerate historically.  I did go ahead and place referral to physical therapy.  Hopefully this will help.  If not then might consider sports medicine.  Also occupational therapy might be helpful to minimize pain with activities of daily living.  Follow-up date to be determined after appointments with physical therapy.  Also follow-up as regular scheduled with your PCP.  Esperanza RichtersEdward Anuoluwapo Mefferd, PA-C

## 2018-10-13 ENCOUNTER — Other Ambulatory Visit: Payer: Self-pay

## 2018-10-14 ENCOUNTER — Encounter: Payer: Self-pay | Admitting: Family Medicine

## 2018-10-14 ENCOUNTER — Ambulatory Visit (INDEPENDENT_AMBULATORY_CARE_PROVIDER_SITE_OTHER): Payer: BC Managed Care – PPO | Admitting: Family Medicine

## 2018-10-14 VITALS — BP 120/78 | HR 76 | Temp 96.6°F | Ht 65.0 in | Wt 188.2 lb

## 2018-10-14 DIAGNOSIS — Z5320 Procedure and treatment not carried out because of patient's decision for unspecified reasons: Secondary | ICD-10-CM

## 2018-10-14 DIAGNOSIS — Z0000 Encounter for general adult medical examination without abnormal findings: Secondary | ICD-10-CM | POA: Diagnosis not present

## 2018-10-14 LAB — COMPREHENSIVE METABOLIC PANEL
ALT: 9 U/L (ref 0–35)
AST: 12 U/L (ref 0–37)
Albumin: 4.7 g/dL (ref 3.5–5.2)
Alkaline Phosphatase: 23 U/L — ABNORMAL LOW (ref 39–117)
BUN: 12 mg/dL (ref 6–23)
CO2: 27 mEq/L (ref 19–32)
Calcium: 9.4 mg/dL (ref 8.4–10.5)
Chloride: 103 mEq/L (ref 96–112)
Creatinine, Ser: 0.84 mg/dL (ref 0.40–1.20)
GFR: 73.93 mL/min (ref >60.00)
Glucose, Bld: 80 mg/dL (ref 70–99)
Potassium: 4.4 mEq/L (ref 3.5–5.1)
Sodium: 139 mEq/L (ref 135–145)
Total Bilirubin: 0.7 mg/dL (ref 0.2–1.2)
Total Protein: 6.9 g/dL (ref 6.0–8.3)

## 2018-10-14 LAB — CBC
HCT: 41.2 % (ref 36.0–46.0)
Hemoglobin: 14.3 g/dL (ref 12.0–15.0)
MCHC: 34.6 g/dL (ref 30.0–36.0)
MCV: 93.9 fl (ref 78.0–100.0)
Platelets: 293 10*3/uL (ref 150.0–400.0)
RBC: 4.39 Mil/uL (ref 3.87–5.11)
RDW: 12.8 % (ref 11.5–15.5)
WBC: 4.8 10*3/uL (ref 4.0–10.5)

## 2018-10-14 LAB — LIPID PANEL
Cholesterol: 193 mg/dL (ref 0–200)
HDL: 55.1 mg/dL (ref >39.00)
LDL Cholesterol: 127 mg/dL — ABNORMAL HIGH (ref 0–99)
NonHDL: 137.88
Total CHOL/HDL Ratio: 4
Triglycerides: 56 mg/dL (ref 0.0–149.0)
VLDL: 11.2 mg/dL (ref 0.0–40.0)

## 2018-10-14 NOTE — Progress Notes (Signed)
Chief Complaint  Patient presents with  . Annual Exam  . Dizziness     Well Woman Kristi Walton is here for a complete physical.   Her last physical was >1 year ago.  Current diet: in general, a "healthy" diet. Current exercise: walking Weight is going down, and she denies daytime fatigue. No LMP recorded..  Seatbelt? Yes  Health Maintenance Pap/HPV- Yes Mammogram- No Tetanus- Yes HIV screening- Yes  Past Medical History:  Diagnosis Date  . Allergy      Past Surgical History:  Procedure Laterality Date  . ivf     july 2003  . MINI-LAPAROTOMY Left    july 2003    Medications  Takes no meds routinely.   Allergies Allergies  Allergen Reactions  . Sulfa Antibiotics     Review of Systems: Constitutional:  no unexpected weight changes Eye:  no recent significant change in vision Ear/Nose/Mouth/Throat:  Ears:  no tinnitus or vertigo and no recent change in hearing Nose/Mouth/Throat:  no complaints of nasal congestion, no sore throat Cardiovascular: no chest pain Respiratory:  no cough and no shortness of breath Gastrointestinal:  no abdominal pain, no change in bowel habits GU:  Female: negative for dysuria or pelvic pain Musculoskeletal/Extremities:  no pain of the joints Integumentary (Skin/Breast):  no abnormal skin lesions reported Neurologic:  no headaches Endocrine:  denies fatigue Hematologic/Lymphatic:  No areas of easy bleeding  Exam BP 120/78 (BP Location: Left Arm, Patient Position: Sitting, Cuff Size: Normal)   Pulse 76   Temp (!) 96.6 F (35.9 C) (Temporal)   Ht 5\' 5"  (1.651 m)   Wt 188 lb 4 oz (85.4 kg)   SpO2 98%   BMI 31.33 kg/m  General:  well developed, well nourished, in no apparent distress Skin:  no significant moles, warts, or growths Head:  no masses, lesions, or tenderness Eyes:  pupils equal and round, sclera anicteric without injection Ears:  canals 100 % obstructed w cerumen b/l Nose:  nares patent, septum midline, mucosa  normal, and no drainage or sinus tenderness Throat/Pharynx:  lips and gingiva without lesion; tongue and uvula midline; non-inflamed pharynx; no exudates or postnasal drainage Neck: neck supple without adenopathy, thyromegaly, or masses Lungs:  clear to auscultation, breath sounds equal bilaterally, no respiratory distress Cardio:  regular rate and rhythm, no bruits, no LE edema Abdomen:  abdomen soft, nontender; bowel sounds normal; no masses or organomegaly Genital: Defer to GYN Musculoskeletal:  symmetrical muscle groups noted without atrophy or deformity Extremities:  no clubbing, cyanosis, or edema, no deformities, no skin discoloration Neuro:  gait normal; deep tendon reflexes normal and symmetric Psych: well oriented with normal range of affect and appropriate judgment/insight  Assessment and Plan  Well adult exam - Plan: CBC, Comprehensive metabolic panel, Lipid panel  Declined Mammogram  Well 43 y.o. female. Counseled on diet and exercise. Yoga, stretching and wt resistance exercises rec'd in addn to walking. Ear flushing at home rec'd. Allergy meds suggested also for ear fullness. Has not had mammogram in 2 yrs. Offered to order today, she declined. I gave her offer to let me know over MyChart at any time and we will set up as this could be lifesaving.  Other orders as above. Follow up in 1 yr or prn. The patient voiced understanding and agreement to the plan.  [place], DO 10/14/18 8:53 AM

## 2018-10-14 NOTE — Patient Instructions (Addendum)
Give [place] 2-3 business days to get the results of your labs back.   Keep the diet clean and stay active.  Consider yoga/weight resistance exercises.  Let me know if you change your mind about your mammogram.  I recommend getting the flu shot in mid October. This suggestion would change if the CDC comes out with a different recommendation.   OK to use Debrox (peroxide) in the ear to loosen up wax. Also recommend using a bulb syringe (for removing boogers from baby's noses) to flush through warm water. Do not use Q-tips as this can impact wax further.  Claritin (loratadine), Allegra (fexofenadine), Zyrtec (cetirizine) which is also equivalent to Xyzal (levocetirizine); these are listed in order from weakest to strongest. Generic, and therefore cheaper, options are in the parentheses.   Flonase (fluticasone); nasal spray that is over the counter. 2 sprays each nostril, once daily. Aim towards the same side eye when you spray.  There are available OTC, and the generic versions, which may be cheaper, are in parentheses. Show this to a pharmacist if you have trouble finding any of these items.  Let us know if you need anything.

## 2019-09-24 ENCOUNTER — Encounter: Payer: Self-pay | Admitting: Family Medicine

## 2019-09-24 ENCOUNTER — Telehealth (INDEPENDENT_AMBULATORY_CARE_PROVIDER_SITE_OTHER): Payer: BC Managed Care – PPO | Admitting: Family Medicine

## 2019-09-24 ENCOUNTER — Other Ambulatory Visit: Payer: Self-pay

## 2019-09-24 DIAGNOSIS — J302 Other seasonal allergic rhinitis: Secondary | ICD-10-CM

## 2019-09-24 MED ORDER — LEVOCETIRIZINE DIHYDROCHLORIDE 5 MG PO TABS: 5.0000 mg | ORAL_TABLET | Freq: Every evening | ORAL | 2 refills | Status: DC

## 2019-09-24 MED ORDER — PREDNISONE 20 MG PO TABS: 40.0000 mg | ORAL_TABLET | Freq: Every day | ORAL | 0 refills | Status: AC

## 2019-09-24 NOTE — Progress Notes (Signed)
Chief Complaint  Patient presents with  . Sinusitis    decreased sense of smell and taste  . Ear Fullness  . Sore Throat    off and on    Hewitt Shorts here for URI complaints. Due to COVID-19 pandemic, we are interacting via web portal for an electronic face-to-face visit. I verified patient's ID using 2 identifiers. Patient agreed to proceed with visit via this method. Patient is at home, I am at office. Patient and I are present for visit.   Duration: 3 weeks  Associated symptoms: sinus congestion, sinus pain, rhinorrhea, itchy watery eyes, ear fullness, ear pain and chest tightness Denies: ear drainage, current sore throat, wheezing, shortness of breath, myalgia and fevers, cough, N/V/D, abd pain Treatment to date: Benadryl Sick contacts: No  Past Medical History:  Diagnosis Date  . Allergy    Exam No conversational dyspnea Age appropriate judgment and insight Nml affect and mood  Seasonal allergic rhinitis, unspecified trigger - Plan: predniSONE (DELTASONE) 20 MG tablet, levocetirizine (XYZAL) 5 MG tablet  Sounds like allergies. Xyzal. Pred burst for ears. Consider INCS.  F/u prn. If starting to experience fevers, shaking, or shortness of breath, seek immediate care. Pt voiced understanding and agreement to the plan.  Jilda Roche [place], DO 09/24/19 2:42 PM

## 2020-02-02 ENCOUNTER — Encounter: Payer: Self-pay | Admitting: Family Medicine

## 2020-02-02 ENCOUNTER — Other Ambulatory Visit: Payer: Self-pay

## 2020-02-02 ENCOUNTER — Ambulatory Visit (INDEPENDENT_AMBULATORY_CARE_PROVIDER_SITE_OTHER): Payer: BC Managed Care – PPO | Admitting: Family Medicine

## 2020-02-02 VITALS — BP 108/70 | HR 72 | Temp 98.4°F | Ht 65.0 in | Wt 164.0 lb

## 2020-02-02 DIAGNOSIS — S161XXA Strain of muscle, fascia and tendon at neck level, initial encounter: Secondary | ICD-10-CM | POA: Diagnosis not present

## 2020-02-02 DIAGNOSIS — H6983 Other specified disorders of Eustachian tube, bilateral: Secondary | ICD-10-CM

## 2020-02-02 MED ORDER — FLUTICASONE PROPIONATE 50 MCG/ACT NA SUSP: 2.0000 | Freq: Every day | NASAL | 2 refills | Status: DC

## 2020-02-02 MED ORDER — CYCLOBENZAPRINE HCL 10 MG PO TABS: 5.0000 mg | ORAL_TABLET | Freq: Three times a day (TID) | ORAL | 0 refills | Status: DC | PRN

## 2020-02-02 NOTE — Patient Instructions (Addendum)
Heat (pad or rice pillow in microwave) over affected area, 10-15 minutes twice daily.   Ice/cold pack over area for 10-15 min twice daily.  OK to take Tylenol 1000 mg (2 extra strength tabs) or 975 mg (3 regular strength tabs) every 6 hours as needed.  Take Flexeril (cyclobenzaprine) 1-2 hours before planned bedtime. If it makes you drowsy, do not take during the day. You can try half a tab the following night.  Flonase (fluticasone); nasal spray that is over the counter. 2 sprays each nostril, once daily. Aim towards the same side eye when you spray.  There are available OTC, and the generic versions, which may be cheaper, are in parentheses. Show this to a pharmacist if you have trouble finding any of these items.  Let us know if you need anything.   EXERCISES RANGE OF MOTION (ROM) AND STRETCHING EXERCISES  These exercises may help you when beginning to rehabilitate your issue. In order to successfully resolve your symptoms, you must improve your posture. These exercises are designed to help reduce the forward-head and rounded-shoulder posture which contributes to this condition. Your symptoms may resolve with or without further involvement from your physician, physical therapist or athletic trainer. While completing these exercises, remember:   Restoring tissue flexibility helps normal motion to return to the joints. This allows healthier, less painful movement and activity.  An effective stretch should be held for at least 20 seconds, although you may need to begin with shorter hold times for comfort.  A stretch should never be painful. You should only feel a gentle lengthening or release in the stretched tissue.  Do not do any stretch or exercise that you cannot tolerate.  STRETCH- Axial Extensors  Lie on your back on the floor. You may bend your knees for comfort. Place a rolled-up hand towel or dish towel, about 2 inches in diameter, under the part of your head that makes contact  with the floor.  Gently tuck your chin, as if trying to make a "double chin," until you feel a gentle stretch at the base of your head.  Hold 15-20 seconds. Repeat 2-3 times. Complete this exercise 1 time per day.   STRETCH - Axial Extension   Stand or sit on a firm surface. Assume a good posture: chest up, shoulders drawn back, abdominal muscles slightly tense, knees unlocked (if standing) and feet hip width apart.  Slowly retract your chin so your head slides back and your chin slightly lowers. Continue to look straight ahead.  You should feel a gentle stretch in the back of your head. Be certain not to feel an aggressive stretch since this can cause headaches later.  Hold for 15-20 seconds. Repeat 2-3 times. Complete this exercise 1 time per day.  STRETCH - Cervical Side Bend   Stand or sit on a firm surface. Assume a good posture: chest up, shoulders drawn back, abdominal muscles slightly tense, knees unlocked (if standing) and feet hip width apart.  Without letting your nose or shoulders move, slowly tip your right / left ear to your shoulder until your feel a gentle stretch in the muscles on the opposite side of your neck.  Hold 15-20 seconds. Repeat 2-3 times. Complete this exercise 1-2 times per day.  STRETCH - Cervical Rotators   Stand or sit on a firm surface. Assume a good posture: chest up, shoulders drawn back, abdominal muscles slightly tense, knees unlocked (if standing) and feet hip width apart.  Keeping your eyes level with  the ground, slowly turn your head until you feel a gentle stretch along the back and opposite side of your neck.  Hold 15-20 seconds. Repeat 2-3 times. Complete this exercise 1-2 times per day.  RANGE OF MOTION - Neck Circles   Stand or sit on a firm surface. Assume a good posture: chest up, shoulders drawn back, abdominal muscles slightly tense, knees unlocked (if standing) and feet hip width apart.  Gently roll your head down and around  from the back of one shoulder to the back of the other. The motion should never be forced or painful.  Repeat the motion 10-20 times, or until you feel the neck muscles relax and loosen. Repeat 2-3 times. Complete the exercise 1-2 times per day. STRENGTHENING EXERCISES - Cervical Strain and Sprain These exercises may help you when beginning to rehabilitate your injury. They may resolve your symptoms with or without further involvement from your physician, physical therapist, or athletic trainer. While completing these exercises, remember:   Muscles can gain both the endurance and the strength needed for everyday activities through controlled exercises.  Complete these exercises as instructed by your physician, physical therapist, or athletic trainer. Progress the resistance and repetitions only as guided.  You may experience muscle soreness or fatigue, but the pain or discomfort you are trying to eliminate should never worsen during these exercises. If this pain does worsen, stop and make certain you are following the directions exactly. If the pain is still present after adjustments, discontinue the exercise until you can discuss the trouble with your clinician.  STRENGTH - Cervical Flexors, Isometric  Face a wall, standing about 6 inches away. Place a small pillow, a ball about 6-8 inches in diameter, or a folded towel between your forehead and the wall.  Slightly tuck your chin and gently push your forehead into the soft object. Push only with mild to moderate intensity, building up tension gradually. Keep your jaw and forehead relaxed.  Hold 10 to 20 seconds. Keep your breathing relaxed.  Release the tension slowly. Relax your neck muscles completely before you start the next repetition. Repeat 2-3 times. Complete this exercise 1 time per day.  STRENGTH- Cervical Lateral Flexors, Isometric   Stand about 6 inches away from a wall. Place a small pillow, a ball about 6-8 inches in  diameter, or a folded towel between the side of your head and the wall.  Slightly tuck your chin and gently tilt your head into the soft object. Push only with mild to moderate intensity, building up tension gradually. Keep your jaw and forehead relaxed.  Hold 10 to 20 seconds. Keep your breathing relaxed.  Release the tension slowly. Relax your neck muscles completely before you start the next repetition. Repeat 2-3 times. Complete this exercise 1 time per day.  STRENGTH - Cervical Extensors, Isometric   Stand about 6 inches away from a wall. Place a small pillow, a ball about 6-8 inches in diameter, or a folded towel between the back of your head and the wall.  Slightly tuck your chin and gently tilt your head back into the soft object. Push only with mild to moderate intensity, building up tension gradually. Keep your jaw and forehead relaxed.  Hold 10 to 20 seconds. Keep your breathing relaxed.  Release the tension slowly. Relax your neck muscles completely before you start the next repetition. Repeat 2-3 times. Complete this exercise 1 time per day.  POSTURE AND BODY MECHANICS CONSIDERATIONS Keeping correct posture when sitting, standing or  completing your activities will reduce the stress put on different body tissues, allowing injured tissues a chance to heal and limiting painful experiences. The following are general guidelines for improved posture. Your physician or physical therapist will provide you with any instructions specific to your needs. While reading these guidelines, remember:  The exercises prescribed by your provider will help you have the flexibility and strength to maintain correct postures.  The correct posture provides the optimal environment for your joints to work. All of your joints have less wear and tear when properly supported by a spine with good posture. This means you will experience a healthier, less painful body.  Correct posture must be practiced with  all of your activities, especially prolonged sitting and standing. Correct posture is as important when doing repetitive low-stress activities (typing) as it is when doing a single heavy-load activity (lifting).  PROLONGED STANDING WHILE SLIGHTLY LEANING FORWARD When completing a task that requires you to lean forward while standing in one place for a long time, place either foot up on a stationary 2- to 4-inch high object to help maintain the best posture. When both feet are on the ground, the low back tends to lose its slight inward curve. If this curve flattens (or becomes too large), then the back and your other joints will experience too much stress, fatigue more quickly, and can cause pain.   RESTING POSITIONS Consider which positions are most painful for you when choosing a resting position. If you have pain with flexion-based activities (sitting, bending, stooping, squatting), choose a position that allows you to rest in a less flexed posture. You would want to avoid curling into a fetal position on your side. If your pain worsens with extension-based activities (prolonged standing, working overhead), avoid resting in an extended position such as sleeping on your stomach. Most people will find more comfort when they rest with their spine in a more neutral position, neither too rounded nor too arched. Lying on a non-sagging bed on your side with a pillow between your knees, or on your back with a pillow under your knees will often provide some relief. Keep in mind, being in any one position for a prolonged period of time, no matter how correct your posture, can still lead to stiffness.  WALKING Walk with an upright posture. Your ears, shoulders, and hips should all line up. OFFICE WORK When working at a desk, create an environment that supports good, upright posture. Without extra support, muscles fatigue and lead to excessive strain on joints and other tissues.  CHAIR:  A chair should be able  to slide under your desk when your back makes contact with the back of the chair. This allows you to work closely.  The chair's height should allow your eyes to be level with the upper part of your monitor and your hands to be slightly lower than your elbows.  Body position: ? Your feet should make contact with the floor. If this is not possible, use a foot rest. ? Keep your ears over your shoulders. This will reduce stress on your neck and low back.

## 2020-02-02 NOTE — Progress Notes (Signed)
Chief Complaint  Patient presents with  . Ear Pain  . Neck Pain    Pt is here for bilateral ear pain, L is worse. Duration: 2 weeks Progression: unchanged Associated symptoms: popping, congestion, coryza and sinus pressure Denies: fevers, injury, bleeding, or discharge from ear Treatment to date: OTC drops, Tylenol, decongestant  Several days of b/l neck pain, worse on L. No inj or change in activity. No new pillows. No neurologic s/s's. Tylenol as above helps.   Past Medical History:  Diagnosis Date  . Allergy     BP 108/70 (BP Location: Left Arm, Patient Position: Sitting, Cuff Size: Normal)   Pulse 72   Temp 98.4 F (36.9 C) (Oral)   Ht 5\' 5"  (1.651 m)   Wt 164 lb (74.4 kg)   SpO2 97%   BMI 27.29 kg/m  General: Awake, alert, appearing stated age HEENT:  L ear- Canal patent without drainage or erythema, TM is slightly retracted, no fluid or erythema R ear- canal patent without drainage or erythema, TM is neg Nose- nares patent and without discharge Mouth- Lips, gums and dentition unremarkable, pharynx is without erythema or exudate Neck: No adenopathy, +TTP over cerv parasp msc b/l, worse on L w knot Lungs: Normal effort, no accessory muscle use Psych: Age appropriate judgment and insight, normal mood and affect  Dysfunction of both eustachian tubes - Plan: fluticasone (FLONASE) 50 MCG/ACT nasal spray  Strain of neck muscle, initial encounter - Plan: cyclobenzaprine (FLEXERIL) 10 MG tablet  1. Counseled this may take 7-14 days to resolve. No signs of infection. 2. Heat, ice, tylenol, stretches/exercises. Warnings about Flexeril verbalized and written down.  F/u prn.  Pt voiced understanding and agreement to the plan.  [place], DO 02/02/20 1:13 PM

## 2020-07-27 ENCOUNTER — Other Ambulatory Visit (HOSPITAL_BASED_OUTPATIENT_CLINIC_OR_DEPARTMENT_OTHER): Payer: Self-pay | Admitting: Family Medicine

## 2020-07-27 DIAGNOSIS — Z1231 Encounter for screening mammogram for malignant neoplasm of breast: Secondary | ICD-10-CM

## 2020-07-31 ENCOUNTER — Encounter (HOSPITAL_BASED_OUTPATIENT_CLINIC_OR_DEPARTMENT_OTHER): Payer: Self-pay

## 2020-07-31 ENCOUNTER — Ambulatory Visit (HOSPITAL_BASED_OUTPATIENT_CLINIC_OR_DEPARTMENT_OTHER)
Admission: RE | Admit: 2020-07-31 | Discharge: 2020-07-31 | Disposition: A | Discharge status: Home / Self Care | Payer: BC Managed Care – PPO | Source: Ambulatory Visit | Attending: Family Medicine | Admitting: Family Medicine

## 2020-07-31 ENCOUNTER — Encounter: Payer: Self-pay | Admitting: Family Medicine

## 2020-07-31 ENCOUNTER — Ambulatory Visit (INDEPENDENT_AMBULATORY_CARE_PROVIDER_SITE_OTHER): Payer: BC Managed Care – PPO | Admitting: Family Medicine

## 2020-07-31 ENCOUNTER — Other Ambulatory Visit: Payer: Self-pay

## 2020-07-31 VITALS — BP 104/68 | HR 74 | Temp 98.3°F | Ht 65.0 in | Wt 161.0 lb

## 2020-07-31 DIAGNOSIS — Z1231 Encounter for screening mammogram for malignant neoplasm of breast: Secondary | ICD-10-CM | POA: Diagnosis not present

## 2020-07-31 DIAGNOSIS — R002 Palpitations: Secondary | ICD-10-CM

## 2020-07-31 DIAGNOSIS — Z23 Encounter for immunization: Secondary | ICD-10-CM

## 2020-07-31 DIAGNOSIS — Z1159 Encounter for screening for other viral diseases: Secondary | ICD-10-CM | POA: Diagnosis not present

## 2020-07-31 DIAGNOSIS — Z0000 Encounter for general adult medical examination without abnormal findings: Secondary | ICD-10-CM | POA: Diagnosis not present

## 2020-07-31 NOTE — Addendum Note (Signed)
Addended by: Scharlene Gloss B on: 07/31/2020 02:11 PM   Modules accepted: Orders

## 2020-07-31 NOTE — Progress Notes (Signed)
Chief Complaint  Patient presents with  . Annual Exam     Well Woman Kristi Walton is here for a complete physical.   Her last physical was >1 year ago.  Current diet: in general, a "healthy" diet. Current exercise: walking.  Weight is stable and she denies fatigue out of ordinary. Seatbelt? Yes Loss of interested in doing things or depression in past 2 weeks? No  Health Maintenance Pap/HPV- No Mammogram- Yes Tetanus- No Hep C screening- No HIV screening- Yes  Past Medical History:  Diagnosis Date  . Allergy      Past Surgical History:  Procedure Laterality Date  . ivf     july 2003  . MINI-LAPAROTOMY Left    july 2003    Medications  Takes no meds routinely.    Allergies Allergies  Allergen Reactions  . Sulfa Antibiotics     Review of Systems: Constitutional:  no unexpected weight changes Eye:  no recent significant change in vision Ear/Nose/Mouth/Throat:  Ears:  no recent change in hearing Nose/Mouth/Throat:  no complaints of nasal congestion, no sore throat Cardiovascular: no chest pain, +intermittent palpitations Respiratory:  no shortness of breath Gastrointestinal:  no abdominal pain, no change in bowel habits GU:  Female: negative for dysuria or pelvic pain Musculoskeletal/Extremities:  no pain of the joints Integumentary (Skin/Breast):  no abnormal skin lesions reported Neurologic:  no headaches Endocrine:  denies fatigue Hematologic/Lymphatic:  No areas of easy bleeding  Exam BP 104/68 (BP Location: Left Arm, Patient Position: Sitting, Cuff Size: Normal)   Pulse 74   Temp 98.3 F (36.8 C) (Oral)   Ht 5\' 5"  (1.651 m)   Wt 161 lb (73 kg)   SpO2 99%   BMI 26.79 kg/m  General:  well developed, well nourished, in no apparent distress Skin:  no significant moles, warts, or growths Head:  no masses, lesions, or tenderness Eyes:  pupils equal and round, sclera anicteric without injection Ears:  canals without lesions, TMs shiny without  retraction, no obvious effusion, no erythema Nose:  nares patent, septum midline, mucosa normal, and no drainage or sinus tenderness Throat/Pharynx:  lips and gingiva without lesion; tongue and uvula midline; non-inflamed pharynx; no exudates or postnasal drainage Neck: neck supple without adenopathy, thyromegaly, or masses Lungs:  clear to auscultation, breath sounds equal bilaterally, no respiratory distress Cardio:  regular rate and rhythm, no LE edema Abdomen:  abdomen soft, nontender; bowel sounds normal; no masses or organomegaly Genital: Defer to GYN Musculoskeletal:  symmetrical muscle groups noted without atrophy or deformity Extremities:  no clubbing, cyanosis, or edema, no deformities, no skin discoloration Neuro:  gait normal; deep tendon reflexes normal and symmetric Psych: well oriented with normal range of affect and appropriate judgment/insight  Assessment and Plan  Well adult exam - Plan: CBC, Comprehensive metabolic panel, Lipid panel  Encounter for hepatitis C screening test for low risk patient - Plan: Hepatitis C antibody  Palpitations - Plan: TSH   Well 45 y.o. female. Counseled on diet and exercise. Other orders as above. Discussed referring now to GI for CCS. She would like to think about it. She has a hx of hemorrhoids and is worried about irritation a/w it. She would not be a good candidate for Cologard given this.  Palpitations likely PVC's. Will ck labs, encourage adequate hydration. If labs neg, pt wants to forego cards referral. Follow up in 1 yr or prn. The patient voiced understanding and agreement to the plan.  [address] [place], DO 07/31/20  2:03 PM

## 2020-07-31 NOTE — Patient Instructions (Addendum)
Give [place] 2-3 business days to get the results of your labs back.   Keep the diet clean and stay active.  If you do not hear anything about your referral in the next 1-2 weeks, call our office and ask for an update.  Schedule your GYN appointment.   Let me know about your thoughts on a colonoscopy.    Let us know if you need anything.

## 2020-08-02 ENCOUNTER — Other Ambulatory Visit (INDEPENDENT_AMBULATORY_CARE_PROVIDER_SITE_OTHER): Payer: BC Managed Care – PPO

## 2020-08-02 ENCOUNTER — Other Ambulatory Visit: Payer: Self-pay

## 2020-08-02 DIAGNOSIS — R002 Palpitations: Secondary | ICD-10-CM | POA: Diagnosis not present

## 2020-08-02 DIAGNOSIS — Z0000 Encounter for general adult medical examination without abnormal findings: Secondary | ICD-10-CM | POA: Diagnosis not present

## 2020-08-02 DIAGNOSIS — Z1159 Encounter for screening for other viral diseases: Secondary | ICD-10-CM

## 2020-08-02 LAB — CBC
HCT: 42 % (ref 36.0–46.0)
Hemoglobin: 14.3 g/dL (ref 12.0–15.0)
MCHC: 34.2 g/dL (ref 30.0–36.0)
MCV: 94.5 fl (ref 78.0–100.0)
Platelets: 260 10*3/uL (ref 150.0–400.0)
RBC: 4.44 Mil/uL (ref 3.87–5.11)
RDW: 13 % (ref 11.5–15.5)
WBC: 6 10*3/uL (ref 4.0–10.5)

## 2020-08-02 LAB — LIPID PANEL
Cholesterol: 171 mg/dL (ref 0–200)
HDL: 58.8 mg/dL (ref >39.00)
LDL Cholesterol: 101 mg/dL — ABNORMAL HIGH (ref 0–99)
NonHDL: 112.31
Total CHOL/HDL Ratio: 3
Triglycerides: 56 mg/dL (ref 0.0–149.0)
VLDL: 11.2 mg/dL (ref 0.0–40.0)

## 2020-08-02 LAB — COMPREHENSIVE METABOLIC PANEL
ALT: 11 U/L (ref 0–35)
AST: 13 U/L (ref 0–37)
Albumin: 4.7 g/dL (ref 3.5–5.2)
Alkaline Phosphatase: 24 U/L — ABNORMAL LOW (ref 39–117)
BUN: 9 mg/dL (ref 6–23)
CO2: 27 mEq/L (ref 19–32)
Calcium: 9.2 mg/dL (ref 8.4–10.5)
Chloride: 102 mEq/L (ref 96–112)
Creatinine, Ser: 0.74 mg/dL (ref 0.40–1.20)
GFR: 98 mL/min (ref >60.00)
Glucose, Bld: 86 mg/dL (ref 70–99)
Potassium: 4 mEq/L (ref 3.5–5.1)
Sodium: 137 mEq/L (ref 135–145)
Total Bilirubin: 1 mg/dL (ref 0.2–1.2)
Total Protein: 6.9 g/dL (ref 6.0–8.3)

## 2020-08-02 LAB — TSH: TSH: 2.57 u[IU]/mL (ref 0.35–4.50)

## 2020-08-03 LAB — HEPATITIS C ANTIBODY
Hepatitis C Ab: NONREACTIVE
SIGNAL TO CUT-OFF: 0.01 (ref <1.00)

## 2021-03-26 ENCOUNTER — Emergency Department (HOSPITAL_BASED_OUTPATIENT_CLINIC_OR_DEPARTMENT_OTHER): Payer: BC Managed Care – PPO

## 2021-03-26 ENCOUNTER — Other Ambulatory Visit: Payer: Self-pay

## 2021-03-26 ENCOUNTER — Encounter (HOSPITAL_BASED_OUTPATIENT_CLINIC_OR_DEPARTMENT_OTHER): Payer: Self-pay

## 2021-03-26 ENCOUNTER — Emergency Department (HOSPITAL_BASED_OUTPATIENT_CLINIC_OR_DEPARTMENT_OTHER)
Admission: EM | Admit: 2021-03-26 | Discharge: 2021-03-26 | Disposition: A | Discharge status: Home / Self Care | Payer: BC Managed Care – PPO | Attending: Emergency Medicine | Admitting: Emergency Medicine

## 2021-03-26 ENCOUNTER — Ambulatory Visit: Payer: BC Managed Care – PPO | Admitting: Family Medicine

## 2021-03-26 DIAGNOSIS — R259 Unspecified abnormal involuntary movements: Secondary | ICD-10-CM | POA: Diagnosis not present

## 2021-03-26 DIAGNOSIS — M79662 Pain in left lower leg: Secondary | ICD-10-CM | POA: Diagnosis not present

## 2021-03-26 DIAGNOSIS — M5137 Other intervertebral disc degeneration, lumbosacral region: Secondary | ICD-10-CM | POA: Diagnosis not present

## 2021-03-26 DIAGNOSIS — M545 Low back pain, unspecified: Secondary | ICD-10-CM | POA: Diagnosis not present

## 2021-03-26 DIAGNOSIS — R55 Syncope and collapse: Secondary | ICD-10-CM | POA: Diagnosis not present

## 2021-03-26 DIAGNOSIS — R200 Anesthesia of skin: Secondary | ICD-10-CM | POA: Diagnosis not present

## 2021-03-26 DIAGNOSIS — R42 Dizziness and giddiness: Secondary | ICD-10-CM | POA: Insufficient documentation

## 2021-03-26 DIAGNOSIS — R202 Paresthesia of skin: Secondary | ICD-10-CM | POA: Insufficient documentation

## 2021-03-26 DIAGNOSIS — M79605 Pain in left leg: Secondary | ICD-10-CM

## 2021-03-26 HISTORY — DX: Allergy status to unspecified drugs, medicaments and biological substances: Z88.9

## 2021-03-26 LAB — URINALYSIS, ROUTINE W REFLEX MICROSCOPIC
Bilirubin Urine: NEGATIVE
Glucose, UA: NEGATIVE mg/dL
Hgb urine dipstick: NEGATIVE
Ketones, ur: NEGATIVE mg/dL
Leukocytes,Ua: NEGATIVE
Nitrite: NEGATIVE
Protein, ur: NEGATIVE mg/dL
Specific Gravity, Urine: <1.005 — ABNORMAL LOW (ref 1.005–1.030)
pH: 6.5 (ref 5.0–8.0)

## 2021-03-26 LAB — COMPREHENSIVE METABOLIC PANEL
ALT: 20 U/L (ref 0–44)
AST: 21 U/L (ref 15–41)
Albumin: 4.6 g/dL (ref 3.5–5.0)
Alkaline Phosphatase: 24 U/L — ABNORMAL LOW (ref 38–126)
Anion gap: 11 (ref 5–15)
BUN: 9 mg/dL (ref 6–20)
CO2: 21 mmol/L — ABNORMAL LOW (ref 22–32)
Calcium: 9.2 mg/dL (ref 8.9–10.3)
Chloride: 104 mmol/L (ref 98–111)
Creatinine, Ser: 0.68 mg/dL (ref 0.44–1.00)
GFR, Estimated: >60 mL/min (ref >60)
Glucose, Bld: 89 mg/dL (ref 70–99)
Potassium: 3.6 mmol/L (ref 3.5–5.1)
Sodium: 136 mmol/L (ref 135–145)
Total Bilirubin: 1 mg/dL (ref 0.3–1.2)
Total Protein: 7.1 g/dL (ref 6.5–8.1)

## 2021-03-26 LAB — CBC WITH DIFFERENTIAL/PLATELET
Abs Immature Granulocytes: 0.05 10*3/uL (ref 0.00–0.07)
Basophils Absolute: 0 10*3/uL (ref 0.0–0.1)
Basophils Relative: 0 %
Eosinophils Absolute: 0.1 10*3/uL (ref 0.0–0.5)
Eosinophils Relative: 1 %
HCT: 41.8 % (ref 36.0–46.0)
Hemoglobin: 15 g/dL (ref 12.0–15.0)
Immature Granulocytes: 1 %
Lymphocytes Relative: 17 %
Lymphs Abs: 1.7 10*3/uL (ref 0.7–4.0)
MCH: 32.5 pg (ref 26.0–34.0)
MCHC: 35.9 g/dL (ref 30.0–36.0)
MCV: 90.5 fL (ref 80.0–100.0)
Monocytes Absolute: 0.6 10*3/uL (ref 0.1–1.0)
Monocytes Relative: 6 %
Neutro Abs: 7.6 10*3/uL (ref 1.7–7.7)
Neutrophils Relative %: 75 %
Platelets: 324 10*3/uL (ref 150–400)
RBC: 4.62 MIL/uL (ref 3.87–5.11)
RDW: 12.2 % (ref 11.5–15.5)
WBC: 10.1 10*3/uL (ref 4.0–10.5)
nRBC: 0 % (ref 0.0–0.2)

## 2021-03-26 LAB — PREGNANCY, URINE: Preg Test, Ur: NEGATIVE

## 2021-03-26 NOTE — ED Triage Notes (Addendum)
Pt with 8x10 piece of paper with c/o list-including dizziness, chills, burning/tingling to LE and groin/abd, urinary sx-states sx started 1/18-NAD-to triage in w/c

## 2021-03-26 NOTE — ED Provider Notes (Signed)
[facility] EMERGENCY DEPARTMENT Provider Note   CSN: [id number] Arrival date & time: 03/26/21  1350     History  Chief Complaint  Patient presents with   Multiple c/o    Kristi Walton is a 46 y.o. female who presents emergency department with complaint of near syncope.  Patient had 2 episodes of near syncope, 1 earlier this week and then 1 today.  She states that she felt lightheaded, felt like she was going to pass out and then got extremely shaky.  She is going to see her primary care today at 2:45 pm but instead came to the emergency department.  Patient denies chest pain or shortness of breath.  She has had intermittent left arm numbness tingling and pain going on for several months.  Patient also complains of pain in her left lower extremity.  She recently went to [place] she recently completed a long car ride on 14 January, sat in the hospital for several hours for her child to have a surgery and then returned on the 21st and 22nd with another long car ride.  She has noticed some painful distended veins in her legs only when standing and has had some tingling and numbness in the left groin.  She also complains of feeling like she cannot fully empty her bladder and is having suprapubic tenderness without urinary symptoms.  She denies fevers, chills, nausea, vomiting  HPI     Home Medications Prior to Admission medications   Not on File      Allergies    Ibuprofen and Sulfa antibiotics    Review of Systems   Review of Systems As per the HPI Physical Exam Updated Vital Signs BP 108/65    Pulse 67    Temp 97.9 F (36.6 C) (Oral)    Resp 18    LMP 03/17/2021    SpO2 100%  Physical Exam Vitals and nursing note reviewed.  Constitutional:      General: She is not in acute distress.    Appearance: She is well-developed. She is not diaphoretic.  HENT:     Head: Normocephalic and atraumatic.     Right Ear: External ear normal.     Left Ear: External ear normal.      Nose: Nose normal.     Mouth/Throat:     Mouth: Mucous membranes are moist.  Eyes:     General: No scleral icterus.    Conjunctiva/sclera: Conjunctivae normal.  Cardiovascular:     Rate and Rhythm: Normal rate and regular rhythm.     Pulses: Normal pulses.     Heart sounds: Normal heart sounds. No murmur heard.   No friction rub. No gallop.  Pulmonary:     Effort: Pulmonary effort is normal. No respiratory distress.     Breath sounds: Normal breath sounds.  Abdominal:     General: Bowel sounds are normal. There is no distension.     Palpations: Abdomen is soft. There is no mass.     Tenderness: There is no abdominal tenderness. There is no guarding.  Musculoskeletal:        General: No swelling or tenderness.     Cervical back: Normal range of motion.  Skin:    General: Skin is warm and dry.  Neurological:     Mental Status: She is alert and oriented to person, place, and time.  Psychiatric:        Behavior: Behavior normal.    ED Results / Procedures / Treatments  Labs (all labs ordered are listed, but only abnormal results are displayed) Labs Reviewed  CBC WITH DIFFERENTIAL/PLATELET  PREGNANCY, URINE  COMPREHENSIVE METABOLIC PANEL  URINALYSIS, ROUTINE W REFLEX MICROSCOPIC  CBG MONITORING, ED    EKG None  Radiology No results found.  Procedures Procedures    Medications Ordered in ED Medications - No data to display  ED Course/ Medical Decision Making/ A&P Clinical Course as of 03/27/21 0014  Mon Mar 26, 2021  1751 Urinalysis, Routine w reflex microscopic Urine, Clean Catch(!) Urine is negative [AH]  1853 CBC WITH DIFFERENTIAL [AH]  1853 Pregnancy, urine [AH]  1853 Comprehensive metabolic panel(!) NO acute findings on cmp, cbc, pregnancy test [AH]  1853 US Venous Img Lower Unilateral Left (DVT) [AH]  1853 DG Lumbar Spine Complete I reviewed images of US venous and Lumbar film- no acute findings. [AH]    Clinical Course User Index [AH] Margarita Mail, PA-C                           Medical Decision Making Patient here with leg pain and near syncope.  The differential for syncope is extensive and includes, but is not limited to: arrythmia (Vtach, SVT, SSS, sinus arrest, AV block, bradycardia) aortic stenosis, AMI, HOCM, PE, atrial myxoma, pulmonary hypertension, orthostatic hypotension, (hypovolemia, drug effect, GB syndrome, micturition, cough, swall) carotid sinus sensitivity, Seizure, TIA/CVA, hypoglycemia,  Vertigo. Patient is PERC negative.  Remainder of exam is negative for DVT.  She is not having any arrhythmias on EKG.  We will have the patient follow-up with her PCP.  Amount and/or Complexity of Data Reviewed Independent Historian: spouse Labs: ordered. Decision-making details documented in ED Course. Radiology: ordered and independent interpretation performed. Decision-making details documented in ED Course. ECG/medicine tests: ordered and independent interpretation performed.    Details: Sinus rhythm at a rate of 70    Final Clinical Impression(s) / ED Diagnoses Final diagnoses:  None    Rx / DC Orders ED Discharge Orders     None         Margarita Mail, PA-C 03/27/21 0017    Sherwood Gambler, MD 03/27/21 2351

## 2021-03-26 NOTE — Discharge Instructions (Signed)
Get help right away if: ?You faint. ?You have any of these symptoms that may indicate trouble with your heart: ?Fast or irregular heartbeats (palpitations). ?Unusual pain in your chest, abdomen, or back. ?Shortness of breath. ?You have a seizure. ?You have a severe headache. ?You are confused. ?You have vision problems. ?You have severe weakness or trouble walking. ?You are bleeding from your mouth or rectum, or have black or tarry stool. ?

## 2021-03-28 ENCOUNTER — Ambulatory Visit (HOSPITAL_BASED_OUTPATIENT_CLINIC_OR_DEPARTMENT_OTHER)
Admission: RE | Admit: 2021-03-28 | Discharge: 2021-03-28 | Disposition: A | Discharge status: Home / Self Care | Payer: BC Managed Care – PPO | Source: Ambulatory Visit | Attending: Family Medicine | Admitting: Family Medicine

## 2021-03-28 ENCOUNTER — Encounter: Payer: Self-pay | Admitting: Family Medicine

## 2021-03-28 ENCOUNTER — Ambulatory Visit (INDEPENDENT_AMBULATORY_CARE_PROVIDER_SITE_OTHER): Payer: BC Managed Care – PPO | Admitting: Family Medicine

## 2021-03-28 ENCOUNTER — Other Ambulatory Visit: Payer: Self-pay

## 2021-03-28 VITALS — BP 104/68 | HR 75 | Temp 98.1°F | Ht 65.0 in | Wt 163.0 lb

## 2021-03-28 DIAGNOSIS — M7632 Iliotibial band syndrome, left leg: Secondary | ICD-10-CM

## 2021-03-28 DIAGNOSIS — R1032 Left lower quadrant pain: Secondary | ICD-10-CM | POA: Insufficient documentation

## 2021-03-28 DIAGNOSIS — M7062 Trochanteric bursitis, left hip: Secondary | ICD-10-CM

## 2021-03-28 MED ORDER — MELOXICAM 15 MG PO TABS: 15.0000 mg | ORAL_TABLET | Freq: Every day | ORAL | 0 refills | Status: AC

## 2021-03-28 NOTE — Patient Instructions (Signed)
We will be in touch regarding your X-ray.  Heat (pad or rice pillow in microwave) over affected area, 10-15 minutes twice daily.   Marland Kitchennwice  OK to take Tylenol 1000 mg (2 extra strength tabs) or 975 mg (3 regular strength tabs) every 6 hours as needed.  Let us know if you need anything.  Hip Exercises It is normal to feel mild stretching, pulling, tightness, or discomfort as you do these exercises, but you should stop right away if you feel sudden pain or your pain gets worse.   STRETCHING AND RANGE OF MOTION EXERCISES These exercises warm up your muscles and joints and improve the movement and flexibility of your hip. These exercises also help to relieve pain, numbness, and tingling. Exercise A: Hamstrings, Supine  Lie on your back. Loop a belt or towel over the ball of your left / right foot. The ball of your foot is on the walking surface, right under your toes. Straighten your left / right knee and slowly pull on the belt to raise your leg. Do not let your left / right knee bend while you do this. Keep your other leg flat on the floor. Raise the left / right leg until you feel a gentle stretch behind your left / right knee or thigh. Hold this position for 30 seconds. Slowly return your leg to the starting position. Repeat2 times. Complete this stretch 3 times per week. Exercise B: Hip Rotators  Lie on your back on a firm surface. Hold your left / right knee with your left / right hand. Hold your ankle with your other hand. Gently pull your left / right knee and rotate your lower leg toward your other shoulder. Pull until you feel a stretch in your buttocks. Keep your hips and shoulders firmly planted while you do this stretch. Hold this position for 30 seconds. Repeat 2 times. Complete this stretch 3 times per week. Exercise C: V-Sit (Hamstrings and Adductors)  Sit on the floor with your legs extended in a large V shape. Keep your knees straight during this exercise. Start  with your head and chest upright, then bend at your waist to reach for your left foot (position A). You should feel a stretch in your right inner thigh. Hold this position for 30 seconds. Then slowly return to the upright position. Bend at your waist to reach forward (position B). You should feel a stretch behind both of your thighs and knees. Hold this position for 30 seconds. Then slowly return to the upright position. Bend at your waist to reach for your right foot (position C). You should feel a stretch in your left inner thigh. Hold this position for 30 seconds. Then slowly return to the upright position. Repeat A, B, and C 2 times each. Complete this stretch 3 times per week. Exercise D: Lunge (Hip Flexors)  Place your left / right knee on the floor and bend your other knee so that is directly over your ankle. You should be half-kneeling. Keep good posture with your head over your shoulders. Tighten your buttocks to point your tailbone downward. This helps your back to keep from arching too much. You should feel a gentle stretch in the front of your left / right thigh and hip. If you do not feel any resistance, slightly slide your other foot forward and then slowly lunge forward so your knee once again lines up over your ankle. Make sure your tailbone continues to point downward. Hold this position for 30  seconds. Repeat 2 times. Complete this stretch 3 times per week.  STRENGTHENING EXERCISES These exercises build strength and endurance in your hip. Endurance is the ability to use your muscles for a long time, even after they get tired. Exercise E: Bridge (Hip Extensors)  Lie on your back on a firm surface with your knees bent and your feet flat on the floor. Tighten your buttocks muscles and lift your bottom off the floor until the trunk of your body is level with your thighs. Do not arch your back. You should feel the muscles working in your buttocks and the back of your thighs. If  you do not feel these muscles, slide your feet 1-2 inches (2.5-5 cm) farther away from your buttocks. Hold this position for 3 seconds. Slowly lower your hips to the starting position. Repeat for a total of 10 repetitions. Let your muscles relax completely between repetitions. If this exercise is too easy, try doing it with your arms crossed over your chest. Repeat 2 times. Complete this exercise 3 times per week. Exercise F: Straight Leg Raises - Hip Abductors  Lie on your side with your left / right leg in the top position. Lie so your head, shoulder, knee, and hip line up with each other. You may bend your bottom knee to help you balance. Roll your hips slightly forward, so your hips are stacked directly over each other and your left / right knee is facing forward. Leading with your heel, lift your top leg 4-6 inches (10-15 cm). You should feel the muscles in your outer hip lifting. Do not let your foot drift forward. Do not let your knee roll toward the ceiling. Hold this position for 1 second. Slowly return to the starting position. Let your muscles relax completely between repetitions. Repeat for a total of 10 repetitions.  Repeat 2 times. Complete this exercise 3 times per week. Exercise G: Straight Leg Raises - Hip Adductors  Lie on your side with your left / right leg in the bottom position. Lie so your head, shoulder, knee, and hip line up. You may place your upper foot in front to help you balance. Roll your hips slightly forward, so your hips are stacked directly over each other and your left / right knee is facing forward. Tense the muscles in your inner thigh and lift your bottom leg 4-6 inches (10-15 cm). Hold this position for 1 second. Slowly return to the starting position. Let your muscles relax completely between repetitions. Repeat for a total of 10 repetitions. Repeat 2 times. Complete this exercise 3 times per week. Exercise H: Straight Leg Raises - Quadriceps  Lie  on your back with your left / right leg extended and your other knee bent. Tense the muscles in the front of your left / right thigh. When you do this, you should see your kneecap slide up or see increased dimpling just above your knee. Tighten these muscles even more and raise your leg 4-6 inches (10-15 cm) off the floor. Hold this position for 3 seconds. Keep these muscles tense as you lower your leg. Relax the muscles slowly and completely between repetitions. Repeat for a total of 10 repetitions. Repeat 2 times. Complete this exercise 3 times per week. Exercise I: Hip Abductors, Standing Tie one end of a rubber exercise band or tubing to a secure surface, such as a table or pole. Loop the other end of the band or tubing around your left / right ankle. Keeping your ankle  with the band or tubing directly opposite of the secured end, step away until there is tension in the tubing or band. Hold onto a chair as needed for balance. Lift your left / right leg out to your side. While you do this: Keep your back upright. Keep your shoulders over your hips. Keep your toes pointing forward. Make sure to use your hip muscles to lift your leg. Do not "throw" your leg or tip your body to lift your leg. Hold this position for 1 second. Slowly return to the starting position. Repeat for a total of 10 repetitions. Repeat 2 times. Complete this exercise 3 times per week. Exercise J: Squats (Quadriceps) Stand in a door frame so your feet and knees are in line with the frame. You may place your hands on the frame for balance. Slowly bend your knees and lower your hips like you are going to sit in a chair. Keep your lower legs in a straight-up-and-down position. Do not let your hips go lower than your knees. Do not bend your knees lower than told by your health care provider. If your hip pain increases, do not bend as low. Hold this position for 1 second. Slowly push with your legs to return to standing.  Do not use your hands to pull yourself to standing. Repeat for a total of 10 repetitions. Repeat 2 times. Complete this exercise 3 times per week. Make sure you discuss any questions you have with your health care provider. Document Released: 03/01/2005 Document Revised: 11/06/2015 Document Reviewed: 02/06/2015 [employer] Interactive Patient Education  2018 [employer]  Iliotibial Band Syndrome Rehab It is normal to feel mild stretching, pulling, tightness, or discomfort as you do these exercises, but you should stop right away if you feel sudden pain or your pain gets worse.  Stretching and range of motion exercises These exercises warm up your muscles and joints and improve the movement and flexibility of your hip and pelvis. Exercise A: Quadriceps, prone    Lie on your abdomen on a firm surface, such as a bed or padded floor. Bend your left / right knee and hold your ankle. If you cannot reach your ankle or pant leg, loop a belt around your foot and grab the belt instead. Gently pull your heel toward your buttocks. Your knee should not slide out to the side. You should feel a stretch in the front of your thigh and knee. Hold this position for 30 seconds. Repeat 2 times. Complete this stretch 3 times per week. Exercise B: Iliotibial band    Lie on your side with your left / right leg in the top position. Bend both of your knees and grab your left / right ankle. Stretch out your bottom arm to help you balance. Slowly bring your top knee back so your thigh goes behind your trunk. Slowly lower your top leg toward the floor until you feel a gentle stretch on the outside of your left / right hip and thigh. If you do not feel a stretch and your knee will not fall farther, place the heel of your other foot on top of your knee and pull your knee down toward the floor with your foot. Hold this position for 30 seconds. Repeat 2 times. Complete this stretch 3 times per week. Strengthening  exercises These exercises build strength and endurance in your hip and pelvis. Endurance is the ability to use your muscles for a long time, even after they get tired. Exercise C: Straight  leg raises (hip abductors)     Lie on your side with your left / right leg in the top position. Lie so your head, shoulder, knee, and hip line up. You may bend your bottom knee to help you balance. Roll your hips slightly forward so your hips are stacked directly over each other and your left / right knee is facing forward. Tense the muscles in your outer thigh and lift your top leg 4-6 inches (10-15 cm). Hold this position for 3 seconds. Repeat for a total of 10 reps. Slowly return to the starting position. Let your muscles relax completely before doing another repetition. Repeat 2 times. Complete this exercise 3 times per week. Exercise D: Straight leg raises (hip extensors) Lie on your abdomen on your bed or a firm surface. You can put a pillow under your hips if that is more comfortable. Bend your left / right knee so your foot is straight up in the air. Squeeze your buttock muscles and lift your left / right thigh off the bed. Do not let your back arch. Tense this muscle as hard as you can without increasing any knee pain. Hold this position for 2 seconds. Repeat for a total of 10 reps Slowly lower your leg to the starting position and allow it to relax completely. Repeat 2 times. Complete this exercise 3 times per week. Exercise E: Hip hike Stand sideways on a bottom step. Stand on your left / right leg with your other foot unsupported next to the step. You can hold onto the railing or wall if needed for balance. Keep your knees straight and your torso square. Then, lift your left / right hip up toward the ceiling. Slowly let your left / right hip lower toward the floor, past the starting position. Your foot should get closer to the floor. Do not lean or bend your knees. Repeat 2 times. Complete this  exercise 3 times per week.  Document Released: 02/11/2005 Document Revised: 10/17/2015 Document Reviewed: 01/13/2015 [employer] Interactive Patient Education  2018 [employer]   EXERCISES RANGE OF MOTION (ROM) AND STRETCHING EXERCISES  These exercises may help you when beginning to rehabilitate your issue. In order to successfully resolve your symptoms, you must improve your posture. These exercises are designed to help reduce the forward-head and rounded-shoulder posture which contributes to this condition. Your symptoms may resolve with or without further involvement from your physician, physical therapist or athletic trainer. While completing these exercises, remember:  Restoring tissue flexibility helps normal motion to return to the joints. This allows healthier, less painful movement and activity. An effective stretch should be held for at least 20 seconds, although you may need to begin with shorter hold times for comfort. A stretch should never be painful. You should only feel a gentle lengthening or release in the stretched tissue. Do not do any stretch or exercise that you cannot tolerate.  STRETCH- Axial Extensors Lie on your back on the floor. You may bend your knees for comfort. Place a rolled-up hand towel or dish towel, about 2 inches in diameter, under the part of your head that makes contact with the floor. Gently tuck your chin, as if trying to make a "double chin," until you feel a gentle stretch at the base of your head. Hold 15-20 seconds. Repeat 2-3 times. Complete this exercise 1 time per day.   STRETCH - Axial Extension  Stand or sit on a firm surface. Assume a good posture: chest up, shoulders drawn back,  abdominal muscles slightly tense, knees unlocked (if standing) and feet hip width apart. Slowly retract your chin so your head slides back and your chin slightly lowers. Continue to look straight ahead. You should feel a gentle stretch in the back of your head. Be  certain not to feel an aggressive stretch since this can cause headaches later. Hold for 15-20 seconds. Repeat 2-3 times. Complete this exercise 1 time per day.  STRETCH - Cervical Side Bend  Stand or sit on a firm surface. Assume a good posture: chest up, shoulders drawn back, abdominal muscles slightly tense, knees unlocked (if standing) and feet hip width apart. Without letting your nose or shoulders move, slowly tip your right / left ear to your shoulder until your feel a gentle stretch in the muscles on the opposite side of your neck. Hold 15-20 seconds. Repeat 2-3 times. Complete this exercise 1-2 times per day.  STRETCH - Cervical Rotators  Stand or sit on a firm surface. Assume a good posture: chest up, shoulders drawn back, abdominal muscles slightly tense, knees unlocked (if standing) and feet hip width apart. Keeping your eyes level with the ground, slowly turn your head until you feel a gentle stretch along the back and opposite side of your neck. Hold 15-20 seconds. Repeat 2-3 times. Complete this exercise 1-2 times per day.  RANGE OF MOTION - Neck Circles  Stand or sit on a firm surface. Assume a good posture: chest up, shoulders drawn back, abdominal muscles slightly tense, knees unlocked (if standing) and feet hip width apart. Gently roll your head down and around from the back of one shoulder to the back of the other. The motion should never be forced or painful. Repeat the motion 10-20 times, or until you feel the neck muscles relax and loosen. Repeat 2-3 times. Complete the exercise 1-2 times per day. STRENGTHENING EXERCISES - Cervical Strain and Sprain These exercises may help you when beginning to rehabilitate your injury. They may resolve your symptoms with or without further involvement from your physician, physical therapist, or athletic trainer. While completing these exercises, remember:  Muscles can gain both the endurance and the strength needed for everyday  activities through controlled exercises. Complete these exercises as instructed by your physician, physical therapist, or athletic trainer. Progress the resistance and repetitions only as guided. You may experience muscle soreness or fatigue, but the pain or discomfort you are trying to eliminate should never worsen during these exercises. If this pain does worsen, stop and make certain you are following the directions exactly. If the pain is still present after adjustments, discontinue the exercise until you can discuss the trouble with your clinician.  STRENGTH - Cervical Flexors, Isometric Face a wall, standing about 6 inches away. Place a small pillow, a ball about 6-8 inches in diameter, or a folded towel between your forehead and the wall. Slightly tuck your chin and gently push your forehead into the soft object. Push only with mild to moderate intensity, building up tension gradually. Keep your jaw and forehead relaxed. Hold 10 to 20 seconds. Keep your breathing relaxed. Release the tension slowly. Relax your neck muscles completely before you start the next repetition. Repeat 2-3 times. Complete this exercise 1 time per day.  STRENGTH- Cervical Lateral Flexors, Isometric  Stand about 6 inches away from a wall. Place a small pillow, a ball about 6-8 inches in diameter, or a folded towel between the side of your head and the wall. Slightly tuck your chin and gently  tilt your head into the soft object. Push only with mild to moderate intensity, building up tension gradually. Keep your jaw and forehead relaxed. Hold 10 to 20 seconds. Keep your breathing relaxed. Release the tension slowly. Relax your neck muscles completely before you start the next repetition. Repeat 2-3 times. Complete this exercise 1 time per day.  STRENGTH - Cervical Extensors, Isometric  Stand about 6 inches away from a wall. Place a small pillow, a ball about 6-8 inches in diameter, or a folded towel between the back of  your head and the wall. Slightly tuck your chin and gently tilt your head back into the soft object. Push only with mild to moderate intensity, building up tension gradually. Keep your jaw and forehead relaxed. Hold 10 to 20 seconds. Keep your breathing relaxed. Release the tension slowly. Relax your neck muscles completely before you start the next repetition. Repeat 2-3 times. Complete this exercise 1 time per day.  POSTURE AND BODY MECHANICS CONSIDERATIONS Keeping correct posture when sitting, standing or completing your activities will reduce the stress put on different body tissues, allowing injured tissues a chance to heal and limiting painful experiences. The following are general guidelines for improved posture. Your physician or physical therapist will provide you with any instructions specific to your needs. While reading these guidelines, remember: The exercises prescribed by your provider will help you have the flexibility and strength to maintain correct postures. The correct posture provides the optimal environment for your joints to work. All of your joints have less wear and tear when properly supported by a spine with good posture. This means you will experience a healthier, less painful body. Correct posture must be practiced with all of your activities, especially prolonged sitting and standing. Correct posture is as important when doing repetitive low-stress activities (typing) as it is when doing a single heavy-load activity (lifting).  PROLONGED STANDING WHILE SLIGHTLY LEANING FORWARD When completing a task that requires you to lean forward while standing in one place for a long time, place either foot up on a stationary 2- to 4-inch high object to help maintain the best posture. When both feet are on the ground, the low back tends to lose its slight inward curve. If this curve flattens (or becomes too large), then the back and your other joints will experience too much stress,  fatigue more quickly, and can cause pain.   RESTING POSITIONS Consider which positions are most painful for you when choosing a resting position. If you have pain with flexion-based activities (sitting, bending, stooping, squatting), choose a position that allows you to rest in a less flexed posture. You would want to avoid curling into a fetal position on your side. If your pain worsens with extension-based activities (prolonged standing, working overhead), avoid resting in an extended position such as sleeping on your stomach. Most people will find more comfort when they rest with their spine in a more neutral position, neither too rounded nor too arched. Lying on a non-sagging bed on your side with a pillow between your knees, or on your back with a pillow under your knees will often provide some relief. Keep in mind, being in any one position for a prolonged period of time, no matter how correct your posture, can still lead to stiffness.  WALKING Walk with an upright posture. Your ears, shoulders, and hips should all line up. OFFICE WORK When working at a desk, create an environment that supports good, upright posture. Without extra support, muscles fatigue  and lead to excessive strain on joints and other tissues.  CHAIR: A chair should be able to slide under your desk when your back makes contact with the back of the chair. This allows you to work closely. The chair's height should allow your eyes to be level with the upper part of your monitor and your hands to be slightly lower than your elbows. Body position: Your feet should make contact with the floor. If this is not possible, use a foot rest. Keep your ears over your shoulders. This will reduce stress on your neck and low back.

## 2021-03-28 NOTE — Progress Notes (Signed)
Musculoskeletal Exam  Patient: Kristi Walton DOB: 1975/09/27  DOS: 03/28/2021  SUBJECTIVE:  Chief Complaint:   Chief Complaint  Patient presents with   Leg Pain    Left Groin and hip pain    Kristi Walton is a 46 y.o.  female for evaluation and treatment of L hip/groin/knee pain.   Onset: 2 years, worse over past 2 yrs. Sat in chair in hosp for around 14 hrs.  Location: groin, outer L hip, outer knee Character:  aching, burning, and tingling  Progression of issue:  has worsened Associated symptoms: weakness in LE's, LLQ abd pain No loss of bowel/bladder control. No bruising, redness, swelling.  Treatment: to date has been ice and acetaminophen.   Neurovascular symptoms: no other neuro s/s's  Past Medical History:  Diagnosis Date   Allergy    H/O seasonal allergies     Objective: VITAL SIGNS: BP 104/68    Pulse 75    Temp 98.1 F (36.7 C) (Oral)    Ht 5\' 5"  (1.651 m)    Wt 163 lb (73.9 kg)    LMP 03/17/2021    SpO2 99%    BMI 27.12 kg/m  Constitutional: Well formed, well developed. No acute distress. Thorax & Lungs: No accessory muscle use Musculoskeletal: L hip.   Normal active range of motion: yes.   Normal passive range of motion: yes Tenderness to palpation: yes, greater troch and lateral thigh No pain w resisted leg abduction.  Deformity: no Ecchymosis: no Tests positive: log roll Tests negative: Stinchfield, FABER, FADDER Neurologic: Normal sensory function.  Psychiatric: Normal mood. Age appropriate judgment and insight. Alert & oriented x 3.    Assessment:  Left inguinal pain - Plan: DG HIP UNILAT WITH PELVIS 2-3 VIEWS LEFT, meloxicam (MOBIC) 15 MG tablet  Greater trochanteric bursitis of left hip - Plan: meloxicam (MOBIC) 15 MG tablet  It band syndrome, left - Plan: meloxicam (MOBIC) 15 MG tablet  Plan: Chronic, unstable. Ck XR to r/o OA of hip. Mobic. Stretches/exercises, heat, ice, Tylenol. Will see how she does w stretches/exercises. Consider  injection vs PT.  F/u in 1 mo pending above. The patient voiced understanding and agreement to the plan.   03/19/2021 [place], DO 03/28/21  12:10 PM

## 2021-08-01 ENCOUNTER — Encounter: Payer: BC Managed Care – PPO | Admitting: Family Medicine

## 2022-09-27 IMAGING — US US EXTREM LOW VENOUS*L*
1 series · 14 of 24 positions shown · non-contrast
Comparison: None.

CLINICAL DATA: Pain left lower extremity

EXAM:
Left LOWER EXTREMITY VENOUS DOPPLER ULTRASOUND
TECHNIQUE: Gray-scale sonography with compression, as well as color and duplex
ultrasound, were performed to evaluate the deep venous system(s)
from the level of the common femoral vein through the popliteal and
proximal calf veins.

[Series 1: us extrem low venous*left* · 14 of 30 slices shown]
[im 1/30]
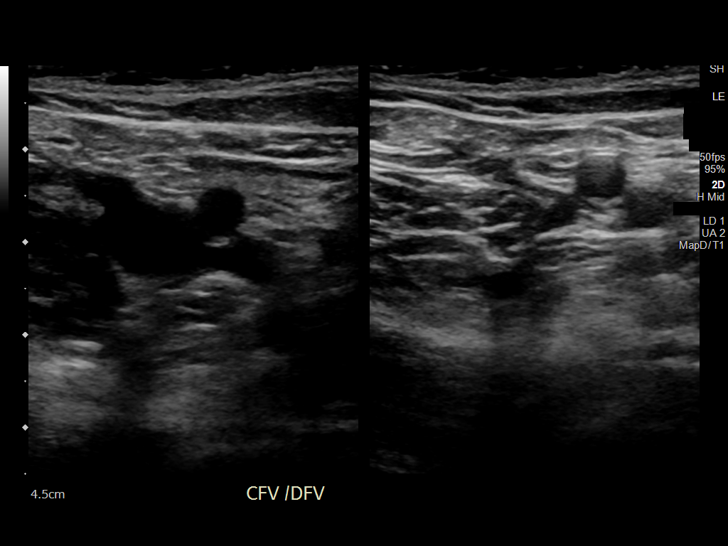
[im 3/30]
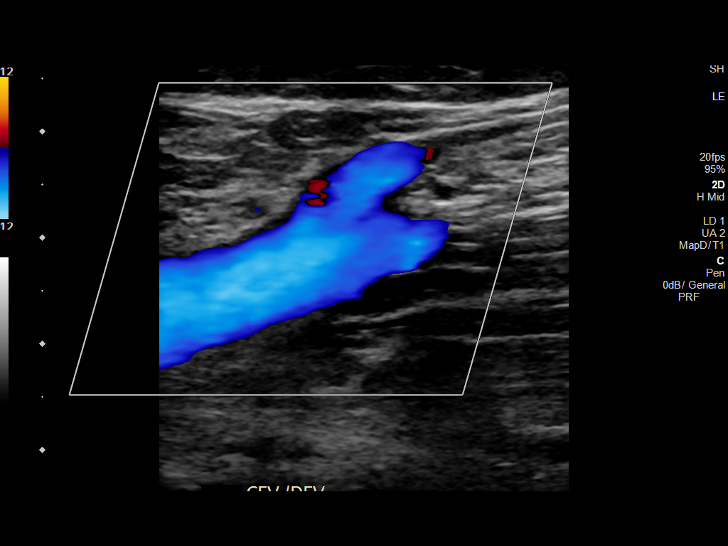
[im 6/30]
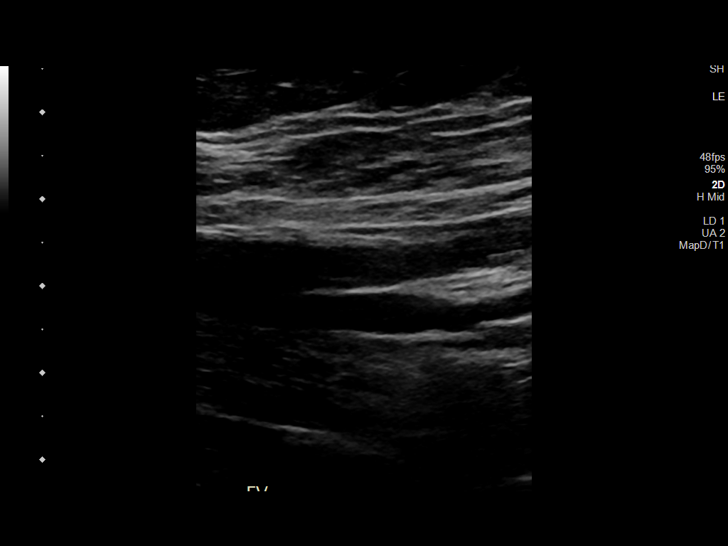
[im 8/30]
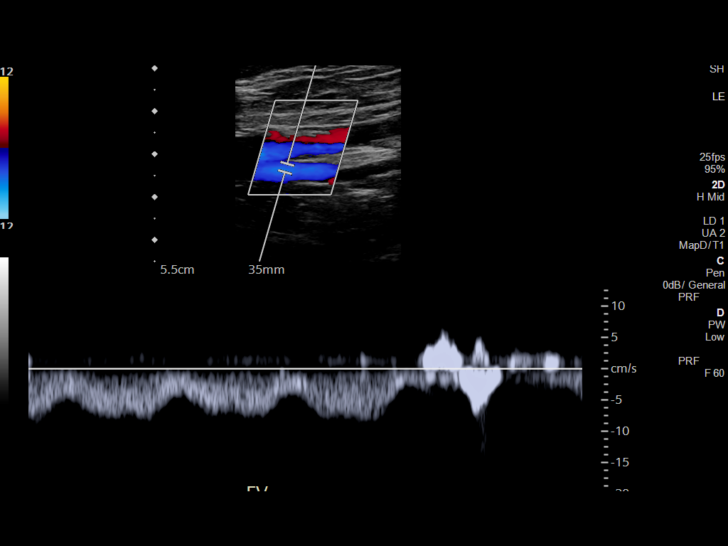
[im 9/30]
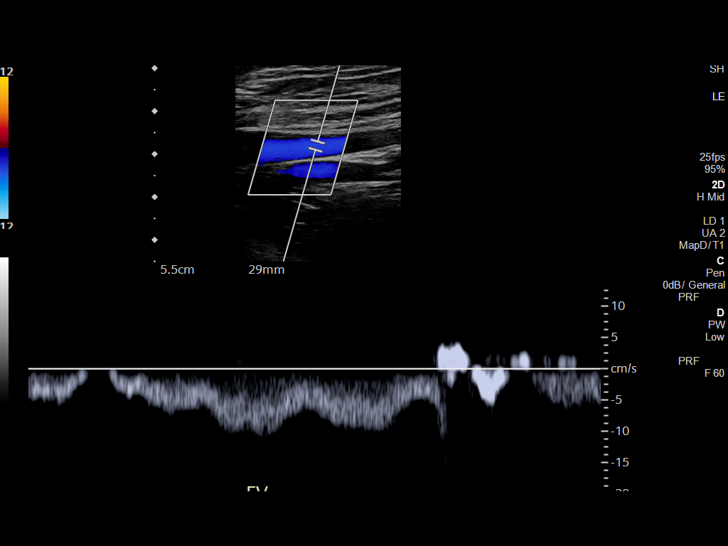
[im 12/30]
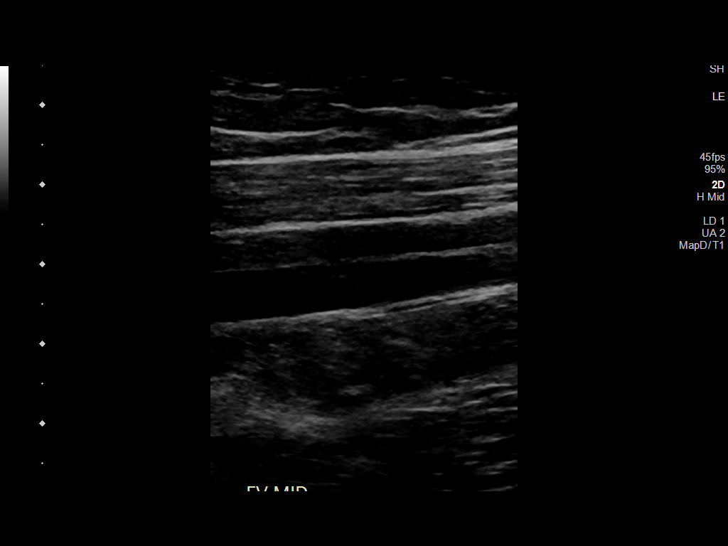
[im 14/30]
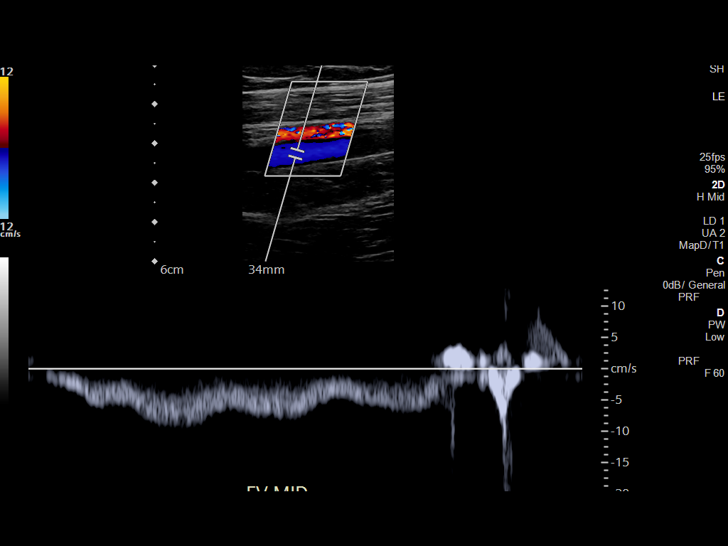
[im 16/30]
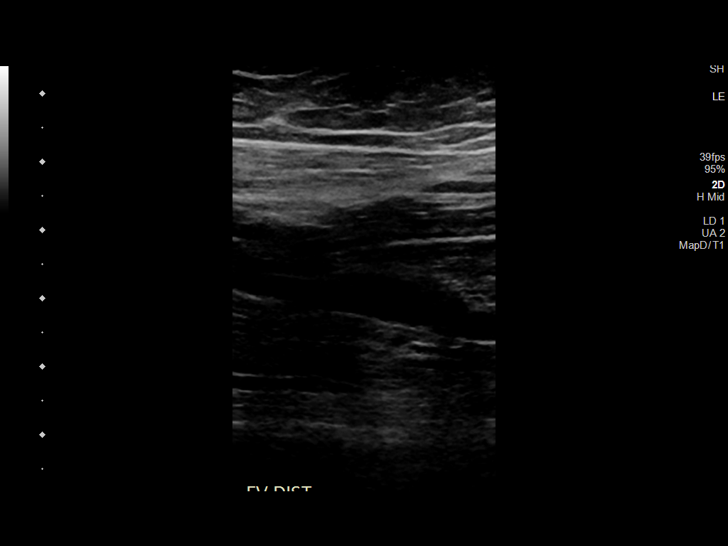
[im 18/30]
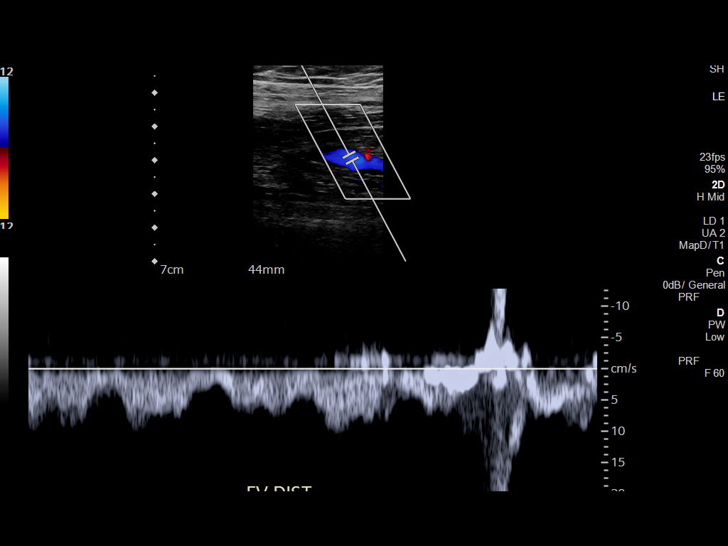
[im 21/30]
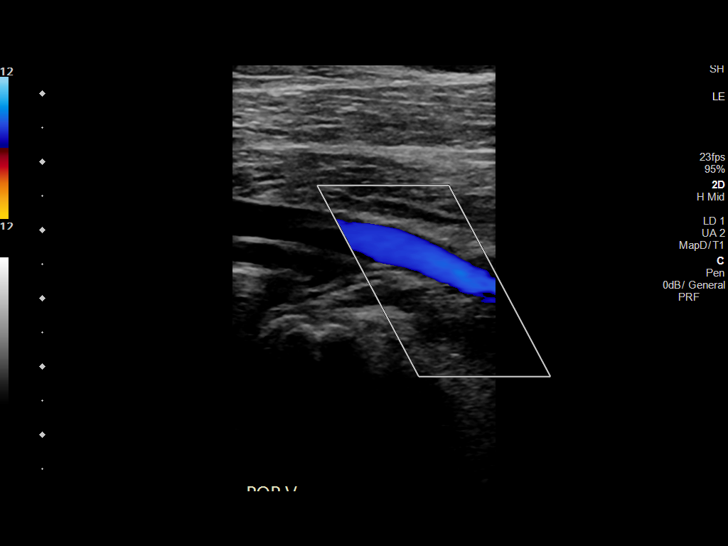
[im 23/30]
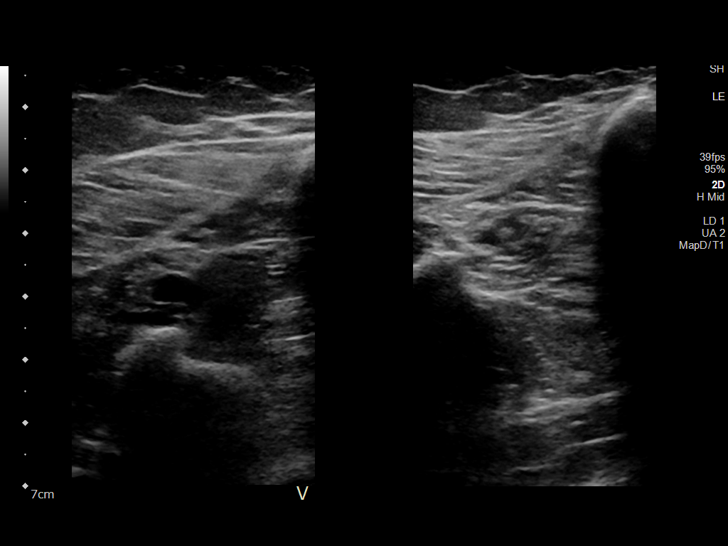
[im 24/30]
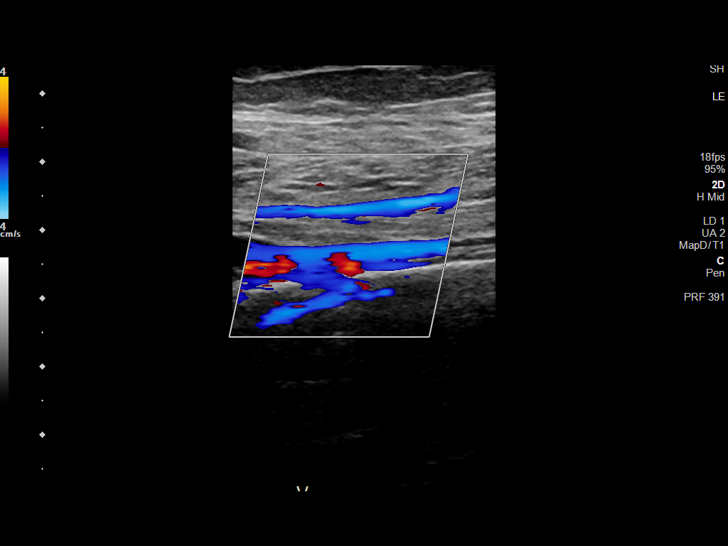
[im 27/30]
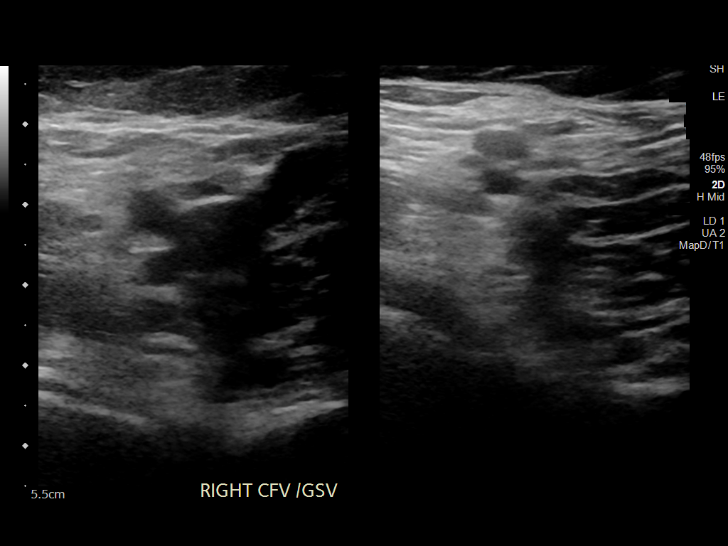
[im 30/30]
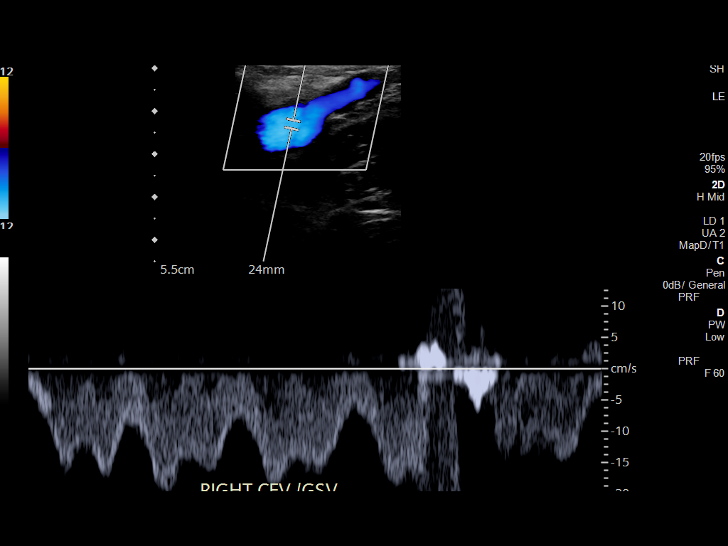

[14 of 24 positions shown; findings below may reference images not displayed]

FINDINGS: VENOUS

Normal compressibility of the common femoral, superficial femoral,
and popliteal veins, as well as the visualized calf veins.
Visualized portions of profunda femoral vein and great saphenous
vein unremarkable. No filling defects to suggest DVT on grayscale or
color Doppler imaging. Doppler waveforms show normal direction of
venous flow, normal respiratory plasticity and response to
augmentation.

Limited views of the contralateral common femoral vein are
unremarkable.

OTHER

None.

Limitations: none
IMPRESSION: There is no evidence of deep venous thrombosis in the left lower
extremity.

## 2022-09-29 IMAGING — DX DG HIP (WITH OR WITHOUT PELVIS) 2-3V*L*
3 series · 3 of 3 positions shown · non-contrast
Comparison: None.

CLINICAL DATA: Left groin pain.

EXAM:
DG HIP (WITH OR WITHOUT PELVIS) 2-3V LEFT

[pelvis ap]
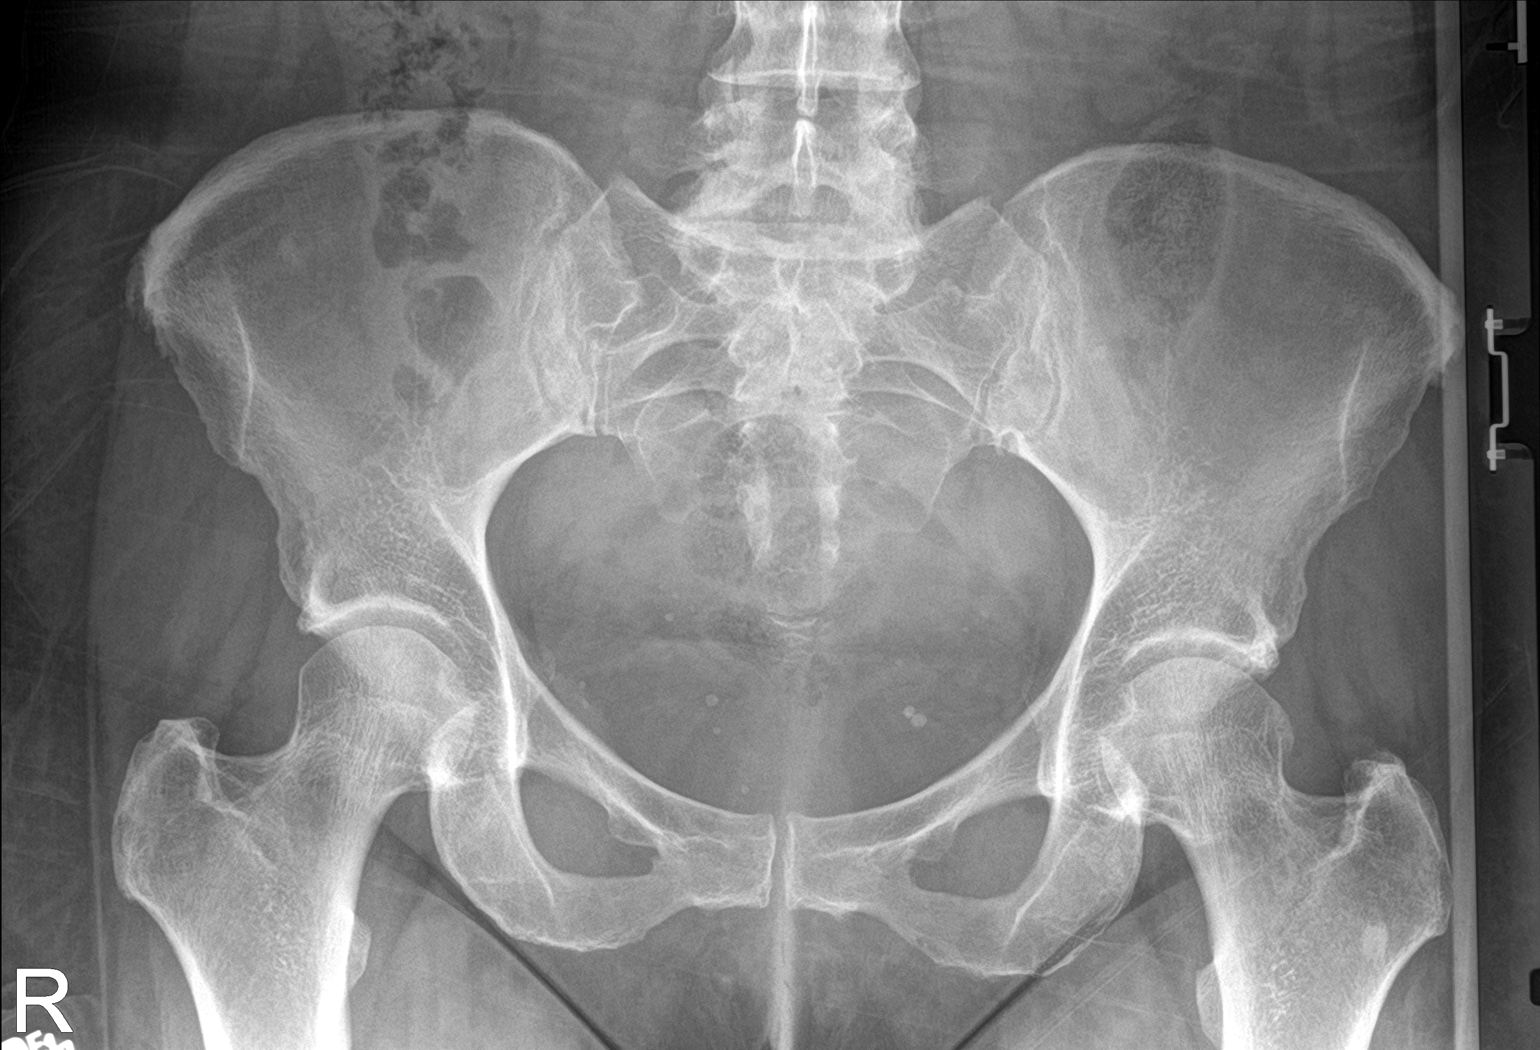

[hip ap]
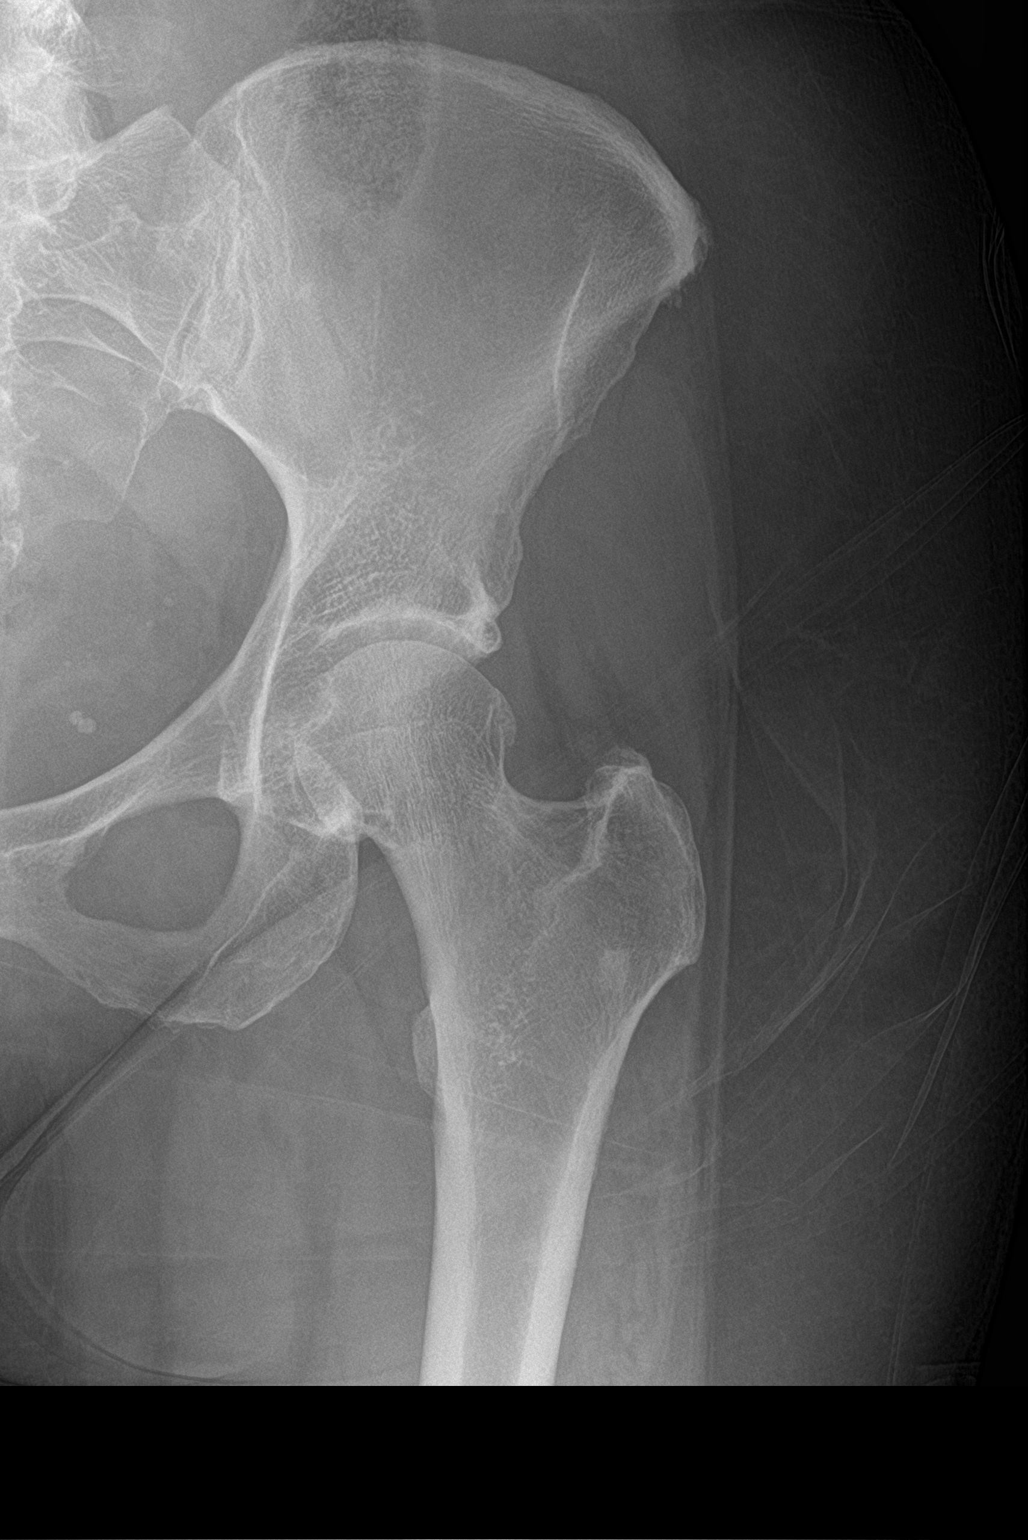

[hip lat]
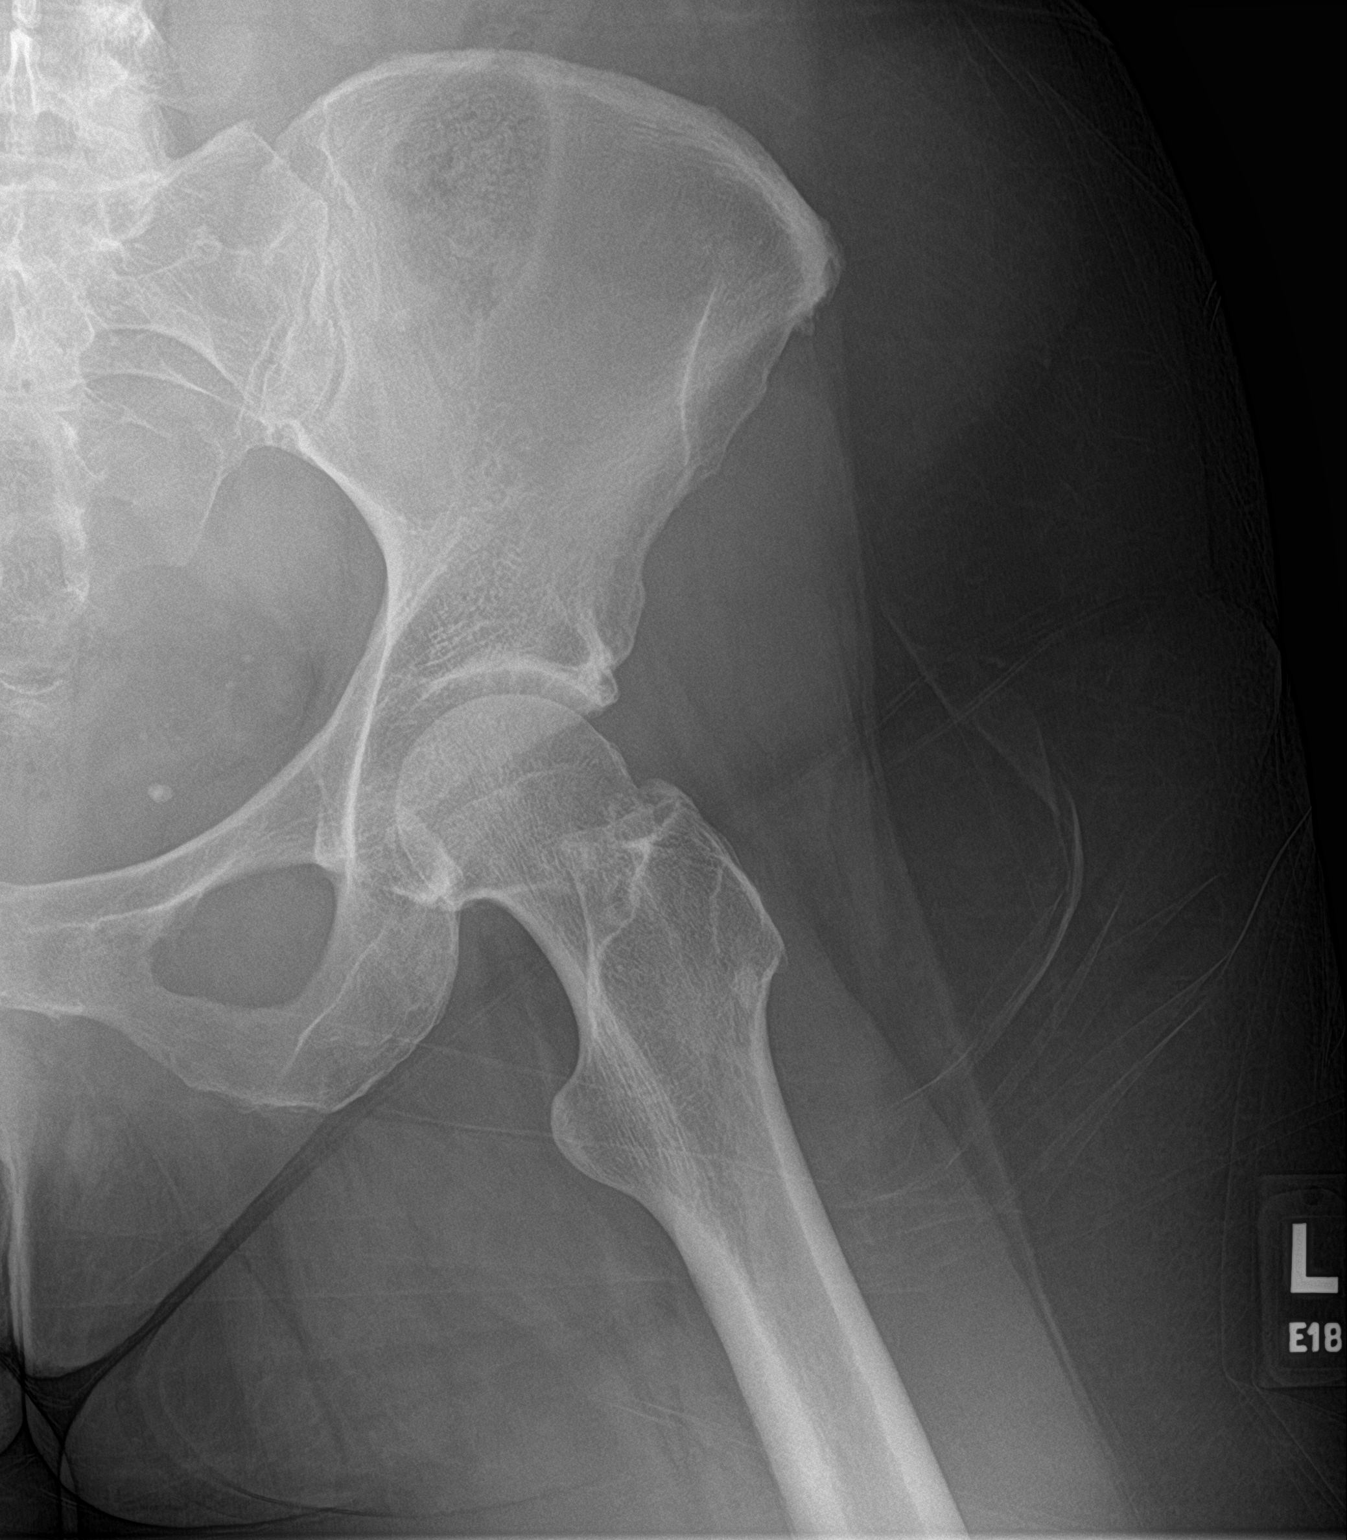

[3 of 3 positions shown; findings below may reference images not displayed]

FINDINGS: Mild superior left femoroacetabular joint space narrowing. Mild left
femoral head-neck junction and mild peripheral left acetabular
degenerative osteophytosis. Small approximate 9 mm oval sclerotic
focus overlying the left intradural trochanteric region compatible
with a bone island.

The right femoroacetabular joint space appears maintained. The
bilateral sacroiliac joint spaces are maintained. Minimal superior
patellar degenerative osteophytosis. Vascular phleboliths overlie
the pelvis.
IMPRESSION: Mild-to-moderate left femoroacetabular osteoarthritis.

## 2023-07-24 ENCOUNTER — Ambulatory Visit: Admitting: Family Medicine

## 2023-07-24 ENCOUNTER — Other Ambulatory Visit (HOSPITAL_COMMUNITY)
Admission: RE | Admit: 2023-07-24 | Discharge: 2023-07-24 | Disposition: A | Discharge status: Home / Self Care | Source: Ambulatory Visit | Attending: Family Medicine | Admitting: Family Medicine

## 2023-07-24 ENCOUNTER — Encounter: Payer: Self-pay | Admitting: Family Medicine

## 2023-07-24 ENCOUNTER — Ambulatory Visit (HOSPITAL_BASED_OUTPATIENT_CLINIC_OR_DEPARTMENT_OTHER)
Admission: RE | Admit: 2023-07-24 | Discharge: 2023-07-24 | Disposition: A | Discharge status: Home / Self Care | Source: Ambulatory Visit | Attending: Family Medicine | Admitting: Family Medicine

## 2023-07-24 VITALS — BP 102/64 | HR 64 | Ht 65.0 in | Wt 180.0 lb

## 2023-07-24 DIAGNOSIS — Z1231 Encounter for screening mammogram for malignant neoplasm of breast: Secondary | ICD-10-CM

## 2023-07-24 DIAGNOSIS — Z1211 Encounter for screening for malignant neoplasm of colon: Secondary | ICD-10-CM | POA: Diagnosis not present

## 2023-07-24 DIAGNOSIS — Z01419 Encounter for gynecological examination (general) (routine) without abnormal findings: Secondary | ICD-10-CM | POA: Diagnosis not present

## 2023-07-24 DIAGNOSIS — N951 Menopausal and female climacteric states: Secondary | ICD-10-CM

## 2023-07-24 NOTE — Progress Notes (Signed)
   ANNUAL EXAM Patient name: Kristi Walton MRN [id number]  Date of birth: [birth year] Chief Complaint:   Annual Exam  History of Present Illness:   Kristi Walton is a 48 y.o.  [phone]  female  being seen today for a routine annual exam.  Current complaints: having some perimenopause symptoms. Menstrual frequency has shortened. Vaginal dryness. Some night sweats  Patient's last menstrual period was 07/11/2023 (exact date).    Last pap 2018. Results were: NILM w/ HRHPV not done. H/O abnormal pap: no Last mammogram: 2022. Results were: normal. Family h/o breast cancer: no Last colonoscopy: none.      07/31/2020    1:26 PM 09/24/2016   11:16 AM 05/29/2016   10:50 AM  Depression screen PHQ 2/9  Decreased Interest 0 0 0  Down, Depressed, Hopeless 0 0 0  PHQ - 2 Score 0 0 0         No data to display           Review of Systems:   Pertinent items are noted in HPI Denies any headaches, blurred vision, fatigue, shortness of breath, chest pain, abdominal pain, abnormal vaginal discharge/itching/odor/irritation, problems with periods, bowel movements, urination, or intercourse unless otherwise stated above. Pertinent History Reviewed:  Reviewed past medical,surgical, social and family history.  Reviewed problem list, medications and allergies. Physical Assessment:   Vitals:   07/24/23 1421  BP: 102/64  Pulse: 64  Weight: 180 lb (81.6 kg)  Height: 5\' 5"  (1.651 m)  Body mass index is 29.95 kg/m.        Physical Examination:   General appearance - well appearing, and in no distress  Mental status - alert, oriented to person, place, and time  Psych:  She has a normal mood and affect  Skin - warm and dry, normal color, no suspicious lesions noted  Chest - effort normal, all lung fields clear to auscultation bilaterally  Heart - normal rate and regular rhythm  Neck:  midline trachea, no thyromegaly or nodules  Breasts - breasts appear normal, no suspicious masses, no skin or nipple  changes or axillary nodes  Abdomen - soft, nontender, nondistended, no masses or organomegaly  Pelvic - VULVA: normal appearing vulva with no masses, tenderness or lesions  VAGINA: normal appearing vagina with normal color and discharge, no lesions  CERVIX: normal appearing cervix without discharge or lesions, no CMT  Thin prep pap is done with HR HPV cotesting  UTERUS: uterus is felt to be normal size, shape, consistency and nontender   ADNEXA: No adnexal masses or tenderness noted.  Extremities:  No swelling or varicosities noted  Chaperone present for exam  Assessment & Plan:  1. Well woman exam with routine gynecological exam (Primary) Preventative screening needed: mammogram and colonoscopy. - Cytology - PAP( [place])  2. Encounter for screening mammogram for malignant neoplasm of breast - MM 3D SCREENING MAMMOGRAM BILATERAL BREAST; Future  3. Colon cancer screening - Ambulatory referral to Gastroenterology  4. Perimenopause Discussed HRT for perimenopausal persons. She will think about it.    Labs/procedures today:   No orders of the defined types were placed in this encounter.   Meds: No orders of the defined types were placed in this encounter.   Follow-up: No follow-ups on file.  Davielle Lingelbach J Terryl Niziolek, DO 07/24/2023 3:00 PM

## 2023-07-29 ENCOUNTER — Ambulatory Visit: Payer: Self-pay | Admitting: Family Medicine

## 2023-07-30 LAB — CYTOLOGY - PAP
Comment: NEGATIVE
Diagnosis: NEGATIVE
High risk HPV: NEGATIVE
